# Patient Record
Sex: Female | Born: 1985 | Race: Black or African American | Hispanic: No | Marital: Single
Health system: Midwestern US, Community
[De-identification: ages and names within clinical notes are randomized; demographics above are authoritative.]

---

## 2018-01-22 ENCOUNTER — Emergency Department: Admit: 2018-01-22 | Payer: PRIVATE HEALTH INSURANCE | Primary: Family Medicine

## 2018-01-22 ENCOUNTER — Inpatient Hospital Stay
Admit: 2018-01-22 | Discharge: 2018-01-22 | Disposition: A | Discharge status: Home / Self Care | Payer: PRIVATE HEALTH INSURANCE | Attending: Emergency Medicine

## 2018-01-22 DIAGNOSIS — J189 Pneumonia, unspecified organism: Secondary | ICD-10-CM

## 2018-01-22 LAB — COMPREHENSIVE METABOLIC PANEL
ALT: 13 U/L (ref 12–78)
AST: 13 U/L — ABNORMAL LOW (ref 15–37)
Albumin: 4.1 gm/dl (ref 3.4–5.0)
Alkaline Phosphatase: 95 U/L (ref 45–117)
Anion Gap: 8 mmol/L (ref 5–15)
BUN: 7 mg/dl (ref 7–25)
CO2: 27 mEq/L (ref 21–32)
Calcium: 9.7 mg/dl (ref 8.5–10.1)
Chloride: 101 mEq/L (ref 98–107)
Creatinine: 1 mg/dl (ref 0.6–1.3)
EGFR IF NonAfrican American: >60
GFR African American: >60
Glucose: 100 mg/dl (ref 74–106)
Potassium: 3.8 mEq/L (ref 3.5–5.1)
Sodium: 135 mEq/L — ABNORMAL LOW (ref 136–145)
Total Bilirubin: 0.4 mg/dl (ref 0.2–1.0)
Total Protein: 8.6 gm/dl — ABNORMAL HIGH (ref 6.4–8.2)

## 2018-01-22 LAB — URINALYSIS W/ RFLX MICROSCOPIC
Bilirubin, Urine: NEGATIVE
Bilirubin: NEGATIVE
Blood, Urine: NEGATIVE
Blood: NEGATIVE
Glucose, Ur: NEGATIVE mg/dl
Glucose: NEGATIVE mg/dl
Ketone: NEGATIVE mg/dl
Ketones, Urine: NEGATIVE mg/dl
Leukocyte Esterase, Urine: NEGATIVE
Leukocyte Esterase: NEGATIVE
Nitrite, Urine: NEGATIVE
Nitrites: NEGATIVE
Protein, UA: 30 mg/dl — AB
Protein: 30 mg/dl — AB
Specific Gravity, UA: 1.015 (ref 1.005–1.030)
Specific gravity: 1.015 (ref 1.005–1.030)
Urobilinogen, UA, POCT: 4 mg/dl — ABNORMAL HIGH (ref 0.0–1.0)
Urobilinogen: 4 mg/dl — ABNORMAL HIGH (ref 0.0–1.0)
pH (UA): 8.5 (ref 5.0–9.0)
pH, UA: 8.5 (ref 5.0–9.0)

## 2018-01-22 LAB — CBC WITH AUTO DIFFERENTIAL
Basophils %: 0.2 % (ref 0–3)
Eosinophils %: 0.2 % (ref 0–5)
Hematocrit: 41 % (ref 37.0–50.0)
Hemoglobin: 13.3 gm/dl (ref 13.0–17.2)
Immature Granulocytes: 0.2 % (ref 0.0–3.0)
Lymphocytes %: 23.3 % — ABNORMAL LOW (ref 28–48)
MCH: 26.8 pg (ref 25.4–34.6)
MCHC: 32.4 gm/dl (ref 30.0–36.0)
MCV: 82.5 fL (ref 80.0–98.0)
MPV: 11.1 fL — ABNORMAL HIGH (ref 6.0–10.0)
Monocytes %: 10.1 % (ref 1–13)
Neutrophils %: 66 % — ABNORMAL HIGH (ref 34–64)
Nucleated RBCs: 0 (ref 0–0)
Platelets: 289 10*3/uL (ref 140–450)
RBC: 4.97 M/uL (ref 3.60–5.20)
RDW-SD: 37.9 (ref 36.4–46.3)
WBC: 4.3 10*3/uL (ref 4.0–11.0)

## 2018-01-22 LAB — POC URINE MACROSCOPIC
Bilirubin, Urine: NEGATIVE
Bilirubin: NEGATIVE
Blood, Urine: NEGATIVE
Blood: NEGATIVE
Glucose, Ur: NEGATIVE mg/dl
Glucose: NEGATIVE mg/dl
Leukocyte Esterase, Urine: NEGATIVE
Leukocyte Esterase: NEGATIVE
Nitrite, Urine: NEGATIVE
Nitrites: NEGATIVE
Protein, UA: 100 mg/dl — AB
Protein: 100 mg/dl — AB
Specific Gravity, UA: 1.02 (ref 1.005–1.030)
Specific gravity: 1.02 (ref 1.005–1.030)
Urobilinogen, UA, POCT: 4 EU/dl — ABNORMAL HIGH (ref 0.0–1.0)
Urobilinogen: 4 EU/dl — ABNORMAL HIGH (ref 0.0–1.0)
pH (UA): 8.5 (ref 5–9)
pH, UA: 8.5 (ref 5–9)

## 2018-01-22 LAB — HCG URINE, QL
HCG urine, QL: NEGATIVE
Pregnancy Test(Urn): NEGATIVE

## 2018-01-22 LAB — PROCALCITONIN
PROCALCITONIN: <0.05 ng/ml (ref 0.00–0.50)
PROCALCITONIN: <0.05 ng/ml (ref 0.00–0.50)

## 2018-01-22 LAB — POC URINE MICROSCOPIC

## 2018-01-22 LAB — METABOLIC PANEL, COMPREHENSIVE
ALT (SGPT): 13 U/L (ref 12–78)
AST (SGOT): 13 U/L — ABNORMAL LOW (ref 15–37)
Albumin: 4.1 gm/dl (ref 3.4–5.0)
Alk. phosphatase: 95 U/L (ref 45–117)
Anion gap: 8 mmol/L (ref 5–15)
BUN: 7 mg/dl (ref 7–25)
Bilirubin, total: 0.4 mg/dl (ref 0.2–1.0)
CO2: 27 mEq/L (ref 21–32)
Calcium: 9.7 mg/dl (ref 8.5–10.1)
Chloride: 101 mEq/L (ref 98–107)
Creatinine: 1 mg/dl (ref 0.6–1.3)
GFR est AA: >60
GFR est non-AA: >60
Glucose: 100 mg/dl (ref 74–106)
Potassium: 3.8 mEq/L (ref 3.5–5.1)
Protein, total: 8.6 gm/dl — ABNORMAL HIGH (ref 6.4–8.2)
Sodium: 135 mEq/L — ABNORMAL LOW (ref 136–145)

## 2018-01-22 LAB — CBC WITH AUTOMATED DIFF
BASOPHILS: 0.2 % (ref 0–3)
EOSINOPHILS: 0.2 % (ref 0–5)
HCT: 41 % (ref 37.0–50.0)
HGB: 13.3 gm/dl (ref 13.0–17.2)
IMMATURE GRANULOCYTES: 0.2 % (ref 0.0–3.0)
LYMPHOCYTES: 23.3 % — ABNORMAL LOW (ref 28–48)
MCH: 26.8 pg (ref 25.4–34.6)
MCHC: 32.4 gm/dl (ref 30.0–36.0)
MCV: 82.5 fL (ref 80.0–98.0)
MONOCYTES: 10.1 % (ref 1–13)
MPV: 11.1 fL — ABNORMAL HIGH (ref 6.0–10.0)
NEUTROPHILS: 66 % — ABNORMAL HIGH (ref 34–64)
NRBC: 0 (ref 0–0)
PLATELET: 289 10*3/uL (ref 140–450)
RBC: 4.97 M/uL (ref 3.60–5.20)
RDW-SD: 37.9 (ref 36.4–46.3)
WBC: 4.3 10*3/uL (ref 4.0–11.0)

## 2018-01-22 MED ORDER — SODIUM CHLORIDE 0.9% BOLUS IV
0.9 % | Freq: Once | INTRAVENOUS | Status: AC
Start: 2018-01-22 — End: 2018-01-22
  Administered 2018-01-22: 22:00:00 via INTRAVENOUS

## 2018-01-22 MED ORDER — ACETAMINOPHEN 325 MG TABLET
325 mg | ORAL | Status: AC
Start: 2018-01-22 — End: 2018-01-22
  Administered 2018-01-22: 20:00:00 via ORAL

## 2018-01-22 MED ORDER — SODIUM CHLORIDE 0.9 % IJ SYRG
Freq: Once | INTRAMUSCULAR | Status: AC
Start: 2018-01-22 — End: 2018-01-22
  Administered 2018-01-22: 22:00:00 via INTRAVENOUS

## 2018-01-22 MED ORDER — IBUPROFEN 600 MG TAB
600 mg | ORAL_TABLET | Freq: Four times a day (QID) | ORAL | 0 refills | Status: AC | PRN
Start: 2018-01-22 — End: ?

## 2018-01-22 MED ORDER — SODIUM CHLORIDE 0.9% BOLUS IV
0.9 % | Freq: Once | INTRAVENOUS | Status: DC
Start: 2018-01-22 — End: 2018-01-22

## 2018-01-22 MED ORDER — LEVOFLOXACIN 750 MG TAB
750 mg | ORAL_TABLET | Freq: Every day | ORAL | 0 refills | Status: AC
Start: 2018-01-22 — End: 2018-01-27

## 2018-01-22 MED FILL — TYLENOL 325 MG TABLET: 325 mg | ORAL | Qty: 3

## 2018-01-22 NOTE — ED Provider Notes (Signed)
[facility]  Emergency Department Treatment Report        Patient: Autumn Velez Age: 32 y.o. Sex: female    Date of Birth: 1985/06/17 Admit Date: 01/22/2018 PCP: Jerline Pain, MD   MRN: [id number]  CSN: [id number]  Attending: Serita Sheller, MD   Room: ER28/ER28 Time Dictated: 4:31 PM APP: Jarold Song, PA-C     Chief Complaint   Chief Complaint   Patient presents with   ??? Fever   ??? Cough       History of Present Illness   32 y.o. female otherwise healthy presenting for evaluation of fever, cough and generally feeling unwell for the past 6 days.  Patient reports that onset of symptoms she just started having fatigue generally feeling unwell, she started getting chills and sweats.  The next day she developed a cough and fever at 102F at home.  She went to urgent care facility where she was given prescription for [facility].  She is still been coughing, she has been unable to bring up much sputum.  She has had persistent fevers, chills and sweats.  No antipyretics today.  She feels generally unwell.  She denies nausea or vomiting.  Denies headache, denies abdominal pain.  Denies irritative voiding symptoms.  She does have chest pain in her right side of her chest when she coughs and feels somewhat short of breath.  She denies known history of pulmonary disease or asthma.  She does not smoke, she denies drug use.  No recent hospitalizations or surgeries.  No known sick contacts.    Review of Systems   Review of Systems   Constitutional: Positive for chills and fever.   HENT: Negative for congestion and sore throat.    Eyes: Negative for blurred vision and double vision.   Respiratory: Positive for cough and shortness of breath.    Cardiovascular: Positive for chest pain. Negative for leg swelling.   Gastrointestinal: Negative for abdominal pain, diarrhea, nausea and vomiting.   Genitourinary: Negative for dysuria and urgency.   Musculoskeletal: Negative for joint pain and myalgias.    Skin: Negative for rash.   Neurological: Negative for sensory change and focal weakness.       Past Medical/Surgical History   Patient denies past medical problems  Patient denies recent surgeries    Social History     Social History     Socioeconomic History   ??? Marital status: SINGLE     Spouse name: Not on file   ??? Number of children: Not on file   ??? Years of education: Not on file   ??? Highest education level: Not on file   Occupational History   ??? Not on file   Social Needs   ??? Financial resource strain: Not on file   ??? Food insecurity:     Worry: Not on file     Inability: Not on file   ??? Transportation needs:     Medical: Not on file     Non-medical: Not on file   Tobacco Use   ??? Smoking status: Not on file   Substance and Sexual Activity   ??? Alcohol use: Not on file   ??? Drug use: Not on file   ??? Sexual activity: Not on file   Lifestyle   ??? Physical activity:     Days per week: Not on file     Minutes per session: Not on file   ??? Stress: Not on file  Relationships   ??? Social connections:     Talks on phone: Not on file     Gets together: Not on file     Attends religious service: Not on file     Active member of club or organization: Not on file     Attends meetings of clubs or organizations: Not on file     Relationship status: Not on file   ??? Intimate partner violence:     Fear of current or ex partner: Not on file     Emotionally abused: Not on file     Physically abused: Not on file     Forced sexual activity: Not on file   Other Topics Concern   ??? Not on file   Social History Narrative   ??? Not on file       Family History   No family history on file.    Current Medications     (Not in a hospital admission)  None     Allergies     Allergies   Allergen Reactions   ??? Peanut Anaphylaxis       Physical Exam     ED Triage Vitals   ED Encounter Vitals Group      BP 01/22/18 1546 119/84      Pulse (Heart Rate) 01/22/18 1546 (!) 117      Resp Rate 01/22/18 1546 16      Temp 01/22/18 1546 (!) 101.6 ??F (38.7 ??C)       Temp src --       O2 Sat (%) 01/22/18 1546 98 %      Weight 01/22/18 1534 188 lb      Height 01/22/18 1534 5' 8"     Physical Exam   Constitutional: She is oriented to person, place, and time.   Nontoxic-appearing female laying on the stretcher no acute distress   HENT:   Head: Normocephalic and atraumatic.   Mouth/Throat: Oropharynx is clear and moist.   Eyes: Conjunctivae are normal.   Neck: Normal range of motion. Neck supple.   Cardiovascular: Regular rhythm, normal heart sounds and intact distal pulses. Exam reveals no gallop and no friction rub.   No murmur heard.  Tachycardic rate   Pulmonary/Chest: Effort normal and breath sounds normal. No respiratory distress. She has no wheezes. She has no rales.   Bilateral lower extremity symmetric without edema, calves are soft and nontender to palpation   Abdominal: Soft. She exhibits no distension. There is no tenderness. There is no rebound and no guarding.   Musculoskeletal: Normal range of motion.   Neurological: She is alert and oriented to person, place, and time.   Skin: Skin is warm and dry.   Nursing note and vitals reviewed.       Impression and Management Plan   Otherwise healthy 32 year old female with fever cough generally feeling unwell for approximately 6 days.  She arrives here febrile at 101.6, she is tachycardic.  She is not hypoxic, she is in no respiratory distress and her lungs are clear.  Upper respiratory source such as pneumonia seems most likely.  We will need to check appropriate labs, obtain cultures and urinalysis.  Obtain chest x-ray and rule out sepsis.  We will start hydration with IV fluids and provide antipyretics.    Diagnostic Studies   Lab:   Recent Results (from the past 12 hour(s))   CBC WITH AUTOMATED DIFF    Collection Time: 01/22/18  5:29 PM  Result Value Ref Range    WBC 4.3 4.0 - 11.0 1000/mm3    RBC 4.97 3.60 - 5.20 M/uL    HGB 13.3 13.0 - 17.2 gm/dl    HCT 41.0 37.0 - 50.0 %    MCV 82.5 80.0 - 98.0 fL     MCH 26.8 25.4 - 34.6 pg    MCHC 32.4 30.0 - 36.0 gm/dl    PLATELET 289 140 - 450 1000/mm3    MPV 11.1 (H) 6.0 - 10.0 fL    RDW-SD 37.9 36.4 - 46.3      NRBC 0 0 - 0      IMMATURE GRANULOCYTES 0.2 0.0 - 3.0 %    NEUTROPHILS 66.0 (H) 34 - 64 %    LYMPHOCYTES 23.3 (L) 28 - 48 %    MONOCYTES 10.1 1 - 13 %    EOSINOPHILS 0.2 0 - 5 %    BASOPHILS 0.2 0 - 3 %   METABOLIC PANEL, COMPREHENSIVE    Collection Time: 01/22/18  5:29 PM   Result Value Ref Range    Sodium 135 (L) 136 - 145 mEq/L    Potassium 3.8 3.5 - 5.1 mEq/L    Chloride 101 98 - 107 mEq/L    CO2 27 21 - 32 mEq/L    Glucose 100 74 - 106 mg/dl    BUN 7 7 - 25 mg/dl    Creatinine 1.0 0.6 - 1.3 mg/dl    GFR est AA >60.0      GFR est non-AA >60      Calcium 9.7 8.5 - 10.1 mg/dl    AST (SGOT) 13 (L) 15 - 37 U/L    ALT (SGPT) 13 12 - 78 U/L    Alk. phosphatase 95 45 - 117 U/L    Bilirubin, total 0.4 0.2 - 1.0 mg/dl    Protein, total 8.6 (H) 6.4 - 8.2 gm/dl    Albumin 4.1 3.4 - 5.0 gm/dl    Anion gap 8 5 - 15 mmol/L   PROCALCITONIN    Collection Time: 01/22/18  5:29 PM   Result Value Ref Range    PROCALCITONIN <0.05 0.00 - 0.50 ng/ml   URINALYSIS W/ RFLX MICROSCOPIC    Collection Time: 01/22/18  5:46 PM   Result Value Ref Range    Color YELLOW YELLOW,STRAW      Appearance HAZY (A) CLEAR      Glucose NEGATIVE NEGATIVE,Negative mg/dl    Bilirubin NEGATIVE NEGATIVE,Negative      Ketone NEGATIVE NEGATIVE,Negative mg/dl    Specific gravity 1.015 1.005 - 1.030      Blood NEGATIVE NEGATIVE,Negative      pH (UA) 8.5 5.0 - 9.0      Protein 30 (A) NEGATIVE,Negative mg/dl    Urobilinogen 4.0 (H) 0.0 - 1.0 mg/dl    Nitrites NEGATIVE NEGATIVE,Negative      Leukocyte Esterase NEGATIVE NEGATIVE,Negative     HCG URINE, QL    Collection Time: 01/22/18  5:46 PM   Result Value Ref Range    HCG urine, QL NEGATIVE NEGATIVE     POC URINE MICROSCOPIC    Collection Time: 01/22/18  5:46 PM   Result Value Ref Range    Epithelial cells, squamous 15-29 /LPF    RBC OCCASIONAL /HPF     CRYSTALS, CALCIUM OXALATE 1+ /HPF   POC URINE MACROSCOPIC    Collection Time: 01/22/18  5:55 PM   Result Value Ref Range    Glucose Negative NEGATIVE,Negative mg/dl  Bilirubin Negative NEGATIVE,Negative      Ketone Trace (A) NEGATIVE,Negative mg/dl    Specific gravity 1.020 1.005 - 1.030      Blood Negative NEGATIVE,Negative      pH (UA) 8.5 5 - 9      Protein 100 (A) NEGATIVE,Negative mg/dl    Urobilinogen 4.0 (H) 0.0 - 1.0 EU/dl    Nitrites Negative NEGATIVE,Negative      Leukocyte Esterase Negative NEGATIVE,Negative      Color Yellow      Appearance Clear       Labs Reviewed   CBC WITH AUTOMATED DIFF - Abnormal; Notable for the following components:       Result Value    MPV 11.1 (*)     NEUTROPHILS 66.0 (*)     LYMPHOCYTES 23.3 (*)     All other components within normal limits   METABOLIC PANEL, COMPREHENSIVE - Abnormal; Notable for the following components:    Sodium 135 (*)     AST (SGOT) 13 (*)     Protein, total 8.6 (*)     All other components within normal limits   URINALYSIS W/ RFLX MICROSCOPIC - Abnormal; Notable for the following components:    Appearance HAZY (*)     Protein 30 (*)     Urobilinogen 4.0 (*)     All other components within normal limits   POC URINE MACROSCOPIC - Abnormal; Notable for the following components:    Ketone Trace (*)     Protein 100 (*)     Urobilinogen 4.0 (*)     All other components within normal limits   CULTURE, BLOOD   CULTURE, BLOOD   CULTURE, URINE   PROCALCITONIN   HCG URINE, QL   POC URINE MICROSCOPIC       Imaging:    Xr Chest Pa Lat    Result Date: 01/22/2018  EXAM: Frontal and lateral views of the chest. INDICATIONS: Cough, fever;    COMPARISON: None. FINDINGS: Right upper lobe airspace opacity, consistent with pneumonia. No pleural effusion or pneumothorax. Normal cardiomediastinal contours. Thoracic spine is normal for age.     IMPRESSION: Right upper lobe airspace opacity, consistent with pneumonia.  Follow-up radiograph in 4-6 weeks following treatment is recommended.       ED Course/Medical Decision Making   Patient was given Tylenol arrival, she was hydrated with IV fluids.  Her chest x-ray confirms right upper lobe opacity consistent with pneumonia.  Discussed with patient she does have a community acquired pneumonia.  Her labs are unremarkable, no evidence of leukocytosis.  Normal procalcitonin.  She is not pregnant.  She is a monitored on cardiac monitor, her pulse has improved, she is hemodynamically stable.  Currently afebrile.  She is in no respiratory distress.  She is able to tolerate p.o. just fine, she is otherwise healthy and stable for outpatient management of her CAP.  She is given prescription for Levaquin as well as Motrin.  She is instructed to start the antibiotic tonight as soon as possible.  She should contact her primary care physician for follow-up in 24-48 hours.  Patient given strict return precautions should she develop new or worsening symptoms such as as difficulty breathing, chest pain, vomiting or inability to tolerate PO, persistent fever over 100.65F at home despite Tylenol and Motrin or other concerns.  Should rest and drink plenty fluids to stay hydrated.  Patient expressed understanding, she is in agreement with this plan.      Medications  sodium chloride 0.9 % bolus infusion 1,000 mL (1,000 mL IntraVENous New Bag 01/22/18 1750)     Followed by   sodium chloride 0.9 % bolus infusion 1,000 mL (1,000 mL IntraVENous New Bag 01/22/18 1750)     Followed by   sodium chloride 0.9 % bolus infusion 559 mL (has no administration in time range)   acetaminophen (TYLENOL) tablet 975 mg (975 mg Oral Given 01/22/18 1604)   sodium chloride (NS) flush 5-10 mL (10 mL IntraVENous Given 01/22/18 1749)     Final Diagnosis       ICD-10-CM ICD-9-CM   1. Community acquired pneumonia of right lung, unspecified part of lung J18.9 486       Disposition   Home in stable condition       Current Discharge Medication List      START taking these medications    Details   levoFLOXacin (LEVAQUIN) 750 mg tablet Take 1 Tab by mouth daily for 5 days.  Qty: 5 Tab, Refills: 0      ibuprofen (MOTRIN) 600 mg tablet Take 1 Tab by mouth every six (6) hours as needed for Pain.  Qty: 20 Tab, Refills: 0           Jarold Song, PA-C  January 22, 2018  4:31 PM    The patient was personally evaluated by myself and Dr. Meda Coffee, Ilene Qua, MD who agrees with the above assessment and plan.  Please note that portions of this note were completed with a voice recognition program. Efforts were made to edit the dictations but occasionally words are mis-transcribed.   My signature above authenticates this document and my orders, the final ??  diagnosis (es), discharge prescription (s), and instructions in the Epic ??  record.  If you have any questions please contact [phone].  ??  Nursing notes have been reviewed by the physician/ advanced practice ??  Clinician.

## 2018-01-22 NOTE — ED Notes (Signed)
Pt alert and oriented. Speech clear and well paced. Pt with bronchospastic cough present. Seen at urgent care ~1 week ago and prescribed tessalon and motrin. No significant relief. Pt has run out of prescribed cough meds and is taking OTC robitussin with minimal relief. States it feels like there is something trying to come up when she coughs but she has not been able to expectorate it. Fevers are persistent despite taking OTC meds (took aleve last night and was given tylenol while in triage).   1618--transported via WC to XR.

## 2018-01-22 NOTE — ED Notes (Signed)
Pt alert and oriented. Speech clear and well paced. Pt given d/c instructions with (2) rx. Reinforced education re: fluids/alternating of tylenol and motrin.   Ambulatory with steady gait. Per provider, pt does not need to complete all IVF as ordered. Pt has completed ~1500 mL of ordered fluids.

## 2018-01-22 NOTE — ED Triage Notes (Signed)
Seen at [facility]  9/21 and diagnosed with URI. Symptoms have continued since with fever and cough- no relief from taking Motrin or prescribed Benzonatate. Patient ran out of the Benzonatate and is now taking OTC Robitussin.

## 2018-01-22 NOTE — ED Provider Notes (Signed)
ED Provider Notes by Raquel James, PA-C at 01/22/18 1631                Author: Raquel James, PA-C  Service: EMERGENCY  Author Type: Physician Assistant       Filed: 01/22/18 1919  Date of Service: 01/22/18 1631  Status: Attested           Editor: Malaia Buchta, Verlan Friends, PA-C (Physician Assistant)  Cosigner: Serita Sheller, MD at 01/22/18 2214          Attestation signed by Serita Sheller, MD at 01/22/18 2214          I interviewed and examined the patient. I discussed the patient with the advanced practice provider, Jarold Song, and I agree with the evaluation and plan  as documented here.      On my exam lungs are clear to auscultation.  Cardiac exam has resolved tachycardia.  Normal heart sounds.  I reviewed the chest x-ray myself.  Patient has right upper lobe pneumonia.  Initially we were concerned for possibility of sepsis.  White blood  cell count is normal and tachycardia is resolved with IV fluids.  Patient is well-appearing and amenable to outpatient treatment.  Patient is tolerating oral fluids without vomiting.  I think she is appropriate candidate for outpatient treatment for pneumonia.   I recommended Levaquin.  I agree with the documentation as detailed below.                                    [facility]   Emergency Department Treatment Report                Patient: Autumn Velez  Age: 32 y.o.  Sex: female          Date of Birth: 1986-04-26  Admit Date: 01/22/2018  PCP: Jerline Pain, MD     MRN: [id number]   CSN: [id number]   Attending: Serita Sheller, MD         Room: ER28/ER28  Time Dictated: 4:31 PM  APP: Jarold Song, PA-C        Chief Complaint      Chief Complaint       Patient presents with        ?  Fever        ?  Cough             History of Present Illness     32 y.o. female  otherwise healthy presenting for evaluation of fever, cough and generally feeling unwell for the past 6 days.  Patient reports that onset of symptoms she just  started having fatigue generally feeling unwell, she started getting chills and sweats.  The  next day she developed a cough and fever at 102F at home.  She went to urgent care facility where she was given prescription for [facility].  She is still been coughing, she has been unable to bring up much sputum.  She has had persistent fevers,  chills and sweats.  No antipyretics today.  She feels generally unwell.  She denies nausea or vomiting.  Denies headache, denies abdominal pain.  Denies irritative voiding symptoms.  She does have chest pain in her right side of her chest when she coughs  and feels somewhat short of breath.  She denies known history of pulmonary disease or asthma.  She does not  smoke, she denies drug use.  No recent hospitalizations or surgeries.  No known sick contacts.        Review of Systems     Review of Systems    Constitutional: Positive for chills and fever .    HENT: Negative for congestion and sore throat.     Eyes: Negative for blurred vision and double vision.    Respiratory: Positive for cough and shortness of breath .     Cardiovascular: Positive for chest pain. Negative for leg swelling.    Gastrointestinal: Negative for abdominal pain, diarrhea, nausea and vomiting.    Genitourinary: Negative for dysuria and urgency.    Musculoskeletal: Negative for joint pain and myalgias.    Skin: Negative for rash.    Neurological: Negative for sensory change and focal weakness.            Past Medical/Surgical History     Patient denies past medical problems   Patient denies recent surgeries        Social History          Social History          Socioeconomic History         ?  Marital status:  SINGLE              Spouse name:  Not on file         ?  Number of children:  Not on file     ?  Years of education:  Not on file     ?  Highest education level:  Not on file       Occupational History        ?  Not on file       Social Needs         ?  Financial resource strain:  Not on file         ?  Food insecurity:              Worry:  Not on file         Inability:  Not on file        ?  Transportation needs:              Medical:  Not on file         Non-medical:  Not on file       Tobacco Use         ?  Smoking status:  Not on file       Substance and Sexual Activity         ?  Alcohol use:  Not on file     ?  Drug use:  Not on file     ?  Sexual activity:  Not on file       Lifestyle        ?  Physical activity:              Days per week:  Not on file         Minutes per session:  Not on file         ?  Stress:  Not on file       Relationships        ?  Social connections:              Talks on phone:  Not on file         Gets together:  Not on file  Attends religious service:  Not on file         Active member of club or organization:  Not on file         Attends meetings of clubs or organizations:  Not on file         Relationship status:  Not on file        ?  Intimate partner violence:              Fear of current or ex partner:  Not on file         Emotionally abused:  Not on file         Physically abused:  Not on file         Forced sexual activity:  Not on file        Other Topics  Concern        ?  Not on file       Social History Narrative        ?  Not on file             Family History     No family history on file.        Current Medications        (Not in a hospital admission)     None          Allergies          Allergies        Allergen  Reactions         ?  Peanut  Anaphylaxis             Physical Exam          ED Triage Vitals     ED Encounter Vitals Group            BP  01/22/18 1546  119/84        Pulse (Heart Rate)  01/22/18 1546  (!) 117        Resp Rate  01/22/18 1546  16        Temp  01/22/18 1546  (!) 101.6 ??F (38.7 ??C)        Temp src  --          O2 Sat (%)  01/22/18 1546  98 %        Weight  01/22/18 1534  188 lb            Height  01/22/18 1534  '5\' 8"'         Physical Exam    Constitutional: She is oriented to person, place, and time.   Nontoxic-appearing female  laying on the stretcher no acute distress    HENT:    Head: Normocephalic and atraumatic.    Mouth/Throat: Oropharynx is clear and moist.    Eyes: Conjunctivae are normal.    Neck: Normal range of motion. Neck supple.    Cardiovascular: Regular rhythm, normal heart sounds and intact distal pulses. Exam reveals no gallop and no friction rub.    No murmur heard.  Tachycardic rate    Pulmonary/Chest: Effort normal and breath sounds normal. No respiratory distress. She has no wheezes. She has no rales.   Bilateral lower extremity symmetric without edema, calves are soft  and nontender to palpation    Abdominal: Soft. She exhibits no distension. There is no tenderness. There is no rebound and no guarding.   Musculoskeletal: Normal range of motion.   Neurological:  She is  alert and oriented to person, place, and time.    Skin: Skin is warm and dry.    Nursing note and vitals reviewed.            Impression and Management Plan     Otherwise healthy 32 year old female with fever cough generally feeling unwell for approximately 6 days.  She arrives here febrile at 101.6, she is tachycardic.   She is not hypoxic, she is in no respiratory distress and her lungs are clear.  Upper respiratory source such as pneumonia seems most likely.  We will need to check appropriate labs, obtain cultures and urinalysis.  Obtain chest x-ray and rule out sepsis.   We will start hydration with IV fluids and provide antipyretics.        Diagnostic Studies     Lab:      Recent Results (from the past 12 hour(s))     CBC WITH AUTOMATED DIFF          Collection Time: 01/22/18  5:29 PM         Result  Value  Ref Range            WBC  4.3  4.0 - 11.0 1000/mm3       RBC  4.97  3.60 - 5.20 M/uL       HGB  13.3  13.0 - 17.2 gm/dl       HCT  41.0  37.0 - 50.0 %       MCV  82.5  80.0 - 98.0 fL       MCH  26.8  25.4 - 34.6 pg       MCHC  32.4  30.0 - 36.0 gm/dl       PLATELET  289  140 - 450 1000/mm3       MPV  11.1 (H)  6.0 - 10.0 fL       RDW-SD  37.9   36.4 - 46.3         NRBC  0  0 - 0         IMMATURE GRANULOCYTES  0.2  0.0 - 3.0 %       NEUTROPHILS  66.0 (H)  34 - 64 %       LYMPHOCYTES  23.3 (L)  28 - 48 %       MONOCYTES  10.1  1 - 13 %       EOSINOPHILS  0.2  0 - 5 %       BASOPHILS  0.2  0 - 3 %       METABOLIC PANEL, COMPREHENSIVE          Collection Time: 01/22/18  5:29 PM         Result  Value  Ref Range            Sodium  135 (L)  136 - 145 mEq/L       Potassium  3.8  3.5 - 5.1 mEq/L       Chloride  101  98 - 107 mEq/L       CO2  27  21 - 32 mEq/L       Glucose  100  74 - 106 mg/dl       BUN  7  7 - 25 mg/dl            Creatinine  1.0  0.6 - 1.3 mg/dl            GFR est  AA  >60.0          GFR est non-AA  >60          Calcium  9.7  8.5 - 10.1 mg/dl       AST (SGOT)  13 (L)  15 - 37 U/L       ALT (SGPT)  13  12 - 78 U/L       Alk. phosphatase  95  45 - 117 U/L       Bilirubin, total  0.4  0.2 - 1.0 mg/dl       Protein, total  8.6 (H)  6.4 - 8.2 gm/dl       Albumin  4.1  3.4 - 5.0 gm/dl       Anion gap  8  5 - 15 mmol/L       PROCALCITONIN          Collection Time: 01/22/18  5:29 PM         Result  Value  Ref Range            PROCALCITONIN  <0.05  0.00 - 0.50 ng/ml       URINALYSIS W/ RFLX MICROSCOPIC          Collection Time: 01/22/18  5:46 PM         Result  Value  Ref Range            Color  YELLOW  YELLOW,STRAW         Appearance  HAZY (A)  CLEAR         Glucose  NEGATIVE  NEGATIVE,Negative mg/dl       Bilirubin  NEGATIVE  NEGATIVE,Negative         Ketone  NEGATIVE  NEGATIVE,Negative mg/dl       Specific gravity  1.015  1.005 - 1.030         Blood  NEGATIVE  NEGATIVE,Negative         pH (UA)  8.5  5.0 - 9.0         Protein  30 (A)  NEGATIVE,Negative mg/dl       Urobilinogen  4.0 (H)  0.0 - 1.0 mg/dl       Nitrites  NEGATIVE  NEGATIVE,Negative         Leukocyte Esterase  NEGATIVE  NEGATIVE,Negative         HCG URINE, QL          Collection Time: 01/22/18  5:46 PM         Result  Value  Ref Range            HCG urine, QL  NEGATIVE  NEGATIVE         POC  URINE MICROSCOPIC          Collection Time: 01/22/18  5:46 PM         Result  Value  Ref Range            Epithelial cells, squamous  15-29  /LPF       RBC  OCCASIONAL  /HPF       CRYSTALS, CALCIUM OXALATE  1+  /HPF       POC URINE MACROSCOPIC          Collection Time: 01/22/18  5:55 PM         Result  Value  Ref Range            Glucose  Negative  NEGATIVE,Negative mg/dl  Bilirubin  Negative  NEGATIVE,Negative         Ketone  Trace (A)  NEGATIVE,Negative mg/dl       Specific gravity  1.020  1.005 - 1.030         Blood  Negative  NEGATIVE,Negative         pH (UA)  8.5  5 - 9         Protein  100 (A)  NEGATIVE,Negative mg/dl       Urobilinogen  4.0 (H)  0.0 - 1.0 EU/dl       Nitrites  Negative  NEGATIVE,Negative         Leukocyte Esterase  Negative  NEGATIVE,Negative         Color  Yellow               Appearance  Clear             Labs Reviewed       CBC WITH AUTOMATED DIFF - Abnormal; Notable for the following components:            Result  Value            MPV  11.1 (*)         NEUTROPHILS  66.0 (*)         LYMPHOCYTES  23.3 (*)            All other components within normal limits       METABOLIC PANEL, COMPREHENSIVE - Abnormal; Notable for the following components:            Sodium  135 (*)         AST (SGOT)  13 (*)         Protein, total  8.6 (*)            All other components within normal limits       URINALYSIS W/ RFLX MICROSCOPIC - Abnormal; Notable for the following components:            Appearance  HAZY (*)         Protein  30 (*)         Urobilinogen  4.0 (*)            All other components within normal limits       POC URINE MACROSCOPIC - Abnormal; Notable for the following components:            Ketone  Trace (*)         Protein  100 (*)         Urobilinogen  4.0 (*)            All other components within normal limits       CULTURE, BLOOD     CULTURE, BLOOD     CULTURE, URINE     PROCALCITONIN     HCG URINE, QL       POC URINE MICROSCOPIC           Imaging:     Xr Chest Pa Lat      Result  Date: 01/22/2018   EXAM: Frontal and lateral views of the chest. INDICATIONS: Cough, fever;    COMPARISON: None. FINDINGS: Right upper lobe airspace opacity, consistent with pneumonia. No pleural effusion or pneumothorax. Normal cardiomediastinal contours. Thoracic spine  is normal for age.       IMPRESSION: Right upper lobe airspace opacity, consistent with pneumonia. Follow-up radiograph in 4-6 weeks following treatment is  recommended.            ED Course/Medical Decision Making     Patient was given Tylenol arrival, she was hydrated with IV fluids.  Her chest x-ray confirms right upper lobe opacity consistent with pneumonia.  Discussed with  patient she does have a community acquired pneumonia.  Her labs are unremarkable, no evidence of leukocytosis.  Normal procalcitonin.  She is not pregnant.  She is a monitored on cardiac monitor, her pulse has improved, she is hemodynamically stable.   Currently afebrile.  She is in no respiratory distress.  She is able to tolerate p.o. just fine, she is otherwise healthy and stable for outpatient management of her CAP.  She is given prescription for Levaquin as well as Motrin.  She is instructed to  start the antibiotic tonight as soon as possible.  She should contact her primary care physician for follow-up in 24-48 hours.  Patient given strict return precautions should she develop new or worsening symptoms such as as difficulty breathing, chest  pain, vomiting or inability to tolerate PO, persistent fever over 100.38F at home despite Tylenol and Motrin or other concerns.  Should rest and drink plenty fluids to stay hydrated.  Patient expressed understanding, she is in agreement with this plan.           Medications       sodium chloride 0.9 % bolus infusion 1,000 mL (1,000 mL IntraVENous New Bag 01/22/18 1750)       Followed by     sodium chloride 0.9 % bolus infusion 1,000 mL (1,000 mL IntraVENous New Bag 01/22/18 1750)       Followed by     sodium chloride 0.9 % bolus  infusion 559 mL (has no administration in time range)     acetaminophen (TYLENOL) tablet 975 mg (975 mg Oral Given 01/22/18 1604)       sodium chloride (NS) flush 5-10 mL (10 mL IntraVENous Given 01/22/18 1749)          Final Diagnosis                 ICD-10-CM  ICD-9-CM          1.  Community acquired pneumonia of right lung, unspecified part of lung  J18.9  486             Disposition     Home in stable condition           Current Discharge Medication List              START taking these medications          Details        levoFLOXacin (LEVAQUIN) 750 mg tablet  Take 1 Tab by mouth daily for 5 days.   Qty: 5 Tab, Refills:  0               ibuprofen (MOTRIN) 600 mg tablet  Take 1 Tab by mouth every six (6) hours as needed for Pain.   Qty: 20 Tab, Refills:  0                      Jarold Song, PA-C   January 22, 2018   4:31 PM      The patient was personally evaluated by myself and Dr. Meda Coffee, Ilene Qua, MD who agrees with the above assessment and plan.   Please note that portions of this note were completed with a voice recognition  program. Efforts were made to edit the dictations but occasionally words are mis-transcribed.    My signature above authenticates this document and my orders, the final ??   diagnosis (es), discharge prescription (s), and instructions in the Epic ??   record.   If you have any questions please contact [phone].   ??   Nursing notes have been reviewed by the physician/ advanced practice ??   Clinician.

## 2018-01-22 NOTE — ED Notes (Signed)
Pt alert and oriented. Speech clear and well paced. Pt with bronchospastic cough present. Seen at urgent care ~1 week ago and prescribed tessalon and motrin. No significant relief. Pt has run out of prescribed cough meds and is taking OTC robitussin with minimal relief. States it feels like there is something trying to come up when she coughs but she has not been able to expectorate it. Fevers are persistent despite taking OTC meds (took aleve last night and was given tylenol while in triage).   1618--transported via WC to XR.

## 2018-01-22 NOTE — ED Notes (Signed)
Seen at [facility]  9/21 and diagnosed with URI. Symptoms have continued since with fever and cough- no relief from taking Motrin or prescribed Benzonatate. Patient ran out of the Benzonatate and is now taking OTC Robitussin.

## 2018-01-24 LAB — CULTURE, URINE
Isolate: 50000
Isolate: 50000

## 2018-01-28 LAB — CULTURE, BLOOD 1
BLOOD CULTURE RESULT: NO GROWTH
BLOOD CULTURE RESULT: NO GROWTH

## 2018-01-28 LAB — CULTURE, BLOOD
Blood Culture Result: NO GROWTH
Blood Culture Result: NO GROWTH

## 2019-02-15 IMAGING — CR DX Chest X-Ray Portable
1 series · 1 of 1 positions shown · non-contrast
Comparison: Chest x-ray 10/10/2015

HISTORY: Chest pain
TECHNIQUE: AP chest x-ray

[chest ap vg]
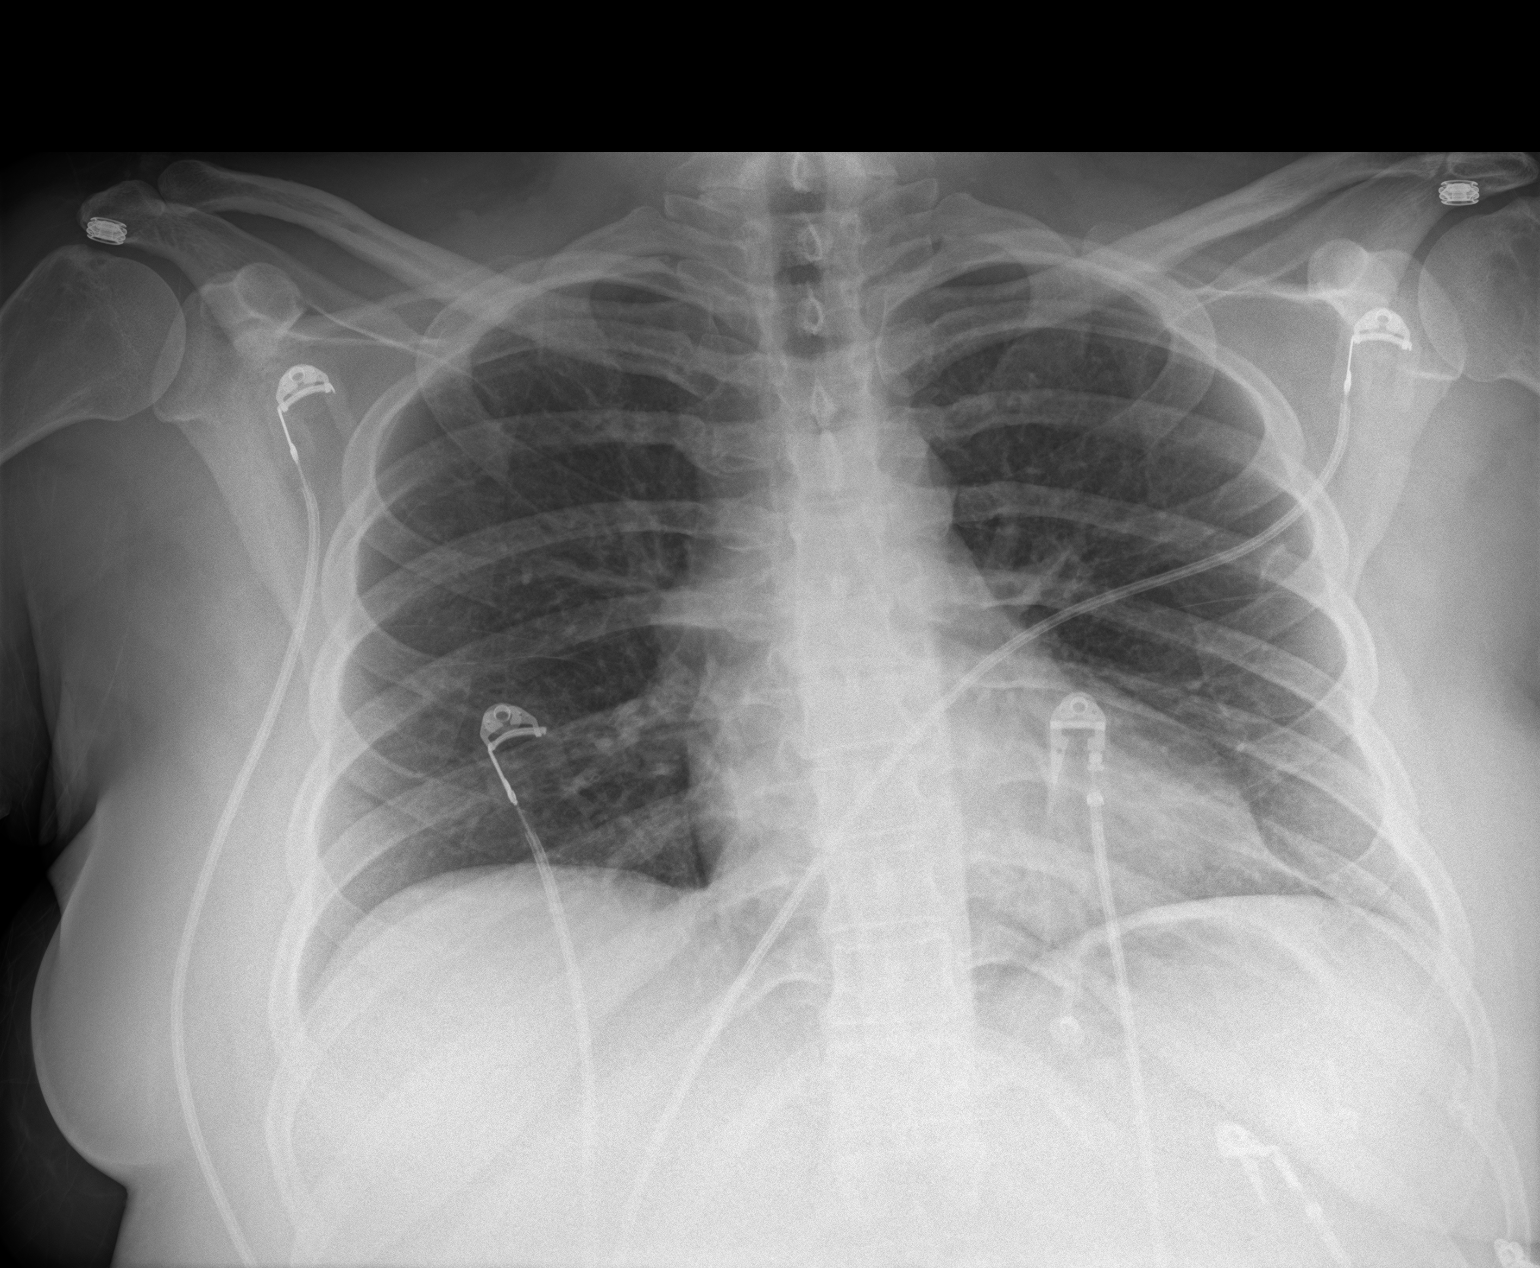

[1 of 1 positions shown; findings below may reference images not displayed]

FINDINGS: No pneumothorax, airspace consolidation, or pleural effusion.                             
 Normal heart size.
IMPRESSION: No acute abnormality is identified on chest x-ray.

## 2019-07-17 LAB — HEMOGLOBIN A1C
Estimated Avg Glucose, External: 103 mg/dL (ref 91–123)
Hemoglobin A1C, External: 5.2 % (ref 4.8–5.6)

## 2020-06-24 ENCOUNTER — Emergency Department: Admit: 2020-06-24 | Payer: BLUE CROSS/BLUE SHIELD | Primary: Family Medicine

## 2020-06-24 ENCOUNTER — Inpatient Hospital Stay
Admit: 2020-06-24 | Discharge: 2020-06-24 | Disposition: A | Discharge status: Home / Self Care | Payer: BLUE CROSS/BLUE SHIELD | Attending: Emergency Medicine

## 2020-06-24 DIAGNOSIS — S66911A Strain of unspecified muscle, fascia and tendon at wrist and hand level, right hand, initial encounter: Secondary | ICD-10-CM

## 2020-06-24 NOTE — ED Notes (Signed)
Pt seen and discharged by NP Louellen.

## 2020-06-24 NOTE — ED Notes (Signed)
Complains of throbbing right wrist pain that radiates to her elbow since this morning. Denies injury to hand.

## 2020-06-24 NOTE — ED Provider Notes (Signed)
ED Provider Notes by Rejeana Brock, NP at 06/24/20 1348                Author: Rejeana Brock, NP  Service: EMERGENCY  Author Type: Nurse Practitioner       Filed: 06/24/20 1514  Date of Service: 06/24/20 1348  Status: Attested           Editor: Rejeana Brock, NP (Nurse Practitioner)  Cosigner: Posey Pronto, MD at 06/24/20 2017          Attestation signed by Posey Pronto, MD at 06/24/20 2017          I have discussed the patient with the mid-level provider. I agree with their evaluation and treatment plan as documented here. I did not see this patient personally,  but I was available to see the patient at their request.                                    [facility]   Emergency Department Treatment Report          Patient: Autumn Velez  Age: 35 y.o.  Sex: female          Date of Birth: 16-Jun-1985  Admit Date: 06/24/2020  PCP: Tomma Lightning, MD     MRN: [id number]   CSN: [id number]   Posey Pronto, MD         Room: T01/T01  Time Dictated: 1:48 PM  Rejeana Brock, NP           Chief Complaint      Chief Complaint       Patient presents with        ?  Wrist Pain              History of Present Illness     35 y.o. female  presents to the emergency department for right wrist pain.  The patient reports that she awoke with some mild discomfort to her right wrist however now has become more of a throbbing pain.  Reports that the pain is located to the pinky side of her hand  and wrist radiating to her elbow.  She denies any recent injuries or falls to her right hand.  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  She is right-hand dominant.      She denies any loss of sensation to her extremities, chest pain, shortness of breath, one-sided weakness.        Review of Systems     Review of Systems    Constitutional: Negative for chills and fever.    HENT: Negative for congestion and sore throat.     Eyes: Negative for  pain.    Respiratory: Negative for cough and shortness of breath.     Cardiovascular: Negative for chest pain.    Gastrointestinal: Negative for abdominal pain, nausea and vomiting.    Genitourinary: Negative for dysuria.    Musculoskeletal: Positive for joint pain (Right wrist pain) . Negative for myalgias.    Skin: Negative for rash.    Neurological: Positive for tingling. Negative for dizziness.            Past Medical/Surgical History     History reviewed. No pertinent past medical history.   History reviewed. No pertinent surgical history.  Social History          Social History          Socioeconomic History         ?  Marital status:  SINGLE       Tobacco Use         ?  Smoking status:  Never Smoker     ?  Smokeless tobacco:  Never Used       Substance and Sexual Activity         ?  Alcohol use:  Not Currently         ?  Drug use:  Never             Family History     History reviewed. No pertinent family history.        Current Medications          Prior to Admission Medications     Prescriptions  Last Dose  Informant  Patient Reported?  Taking?      ibuprofen (MOTRIN) 600 mg tablet      No  No      Sig: Take 1 Tab by mouth every six (6) hours as needed for Pain.      norethindrone-ethinyl estradiol-iron (MICROGESTIN FE1.5/30) 1.5 mg-30 mcg (21)/75 mg (7) tab      Yes  Yes      Sig: Take 1 Tablet by mouth daily.               Facility-Administered Medications: None             Allergies          Allergies        Allergen  Reactions         ?  Peanut  Anaphylaxis             Physical Exam        Patient Vitals for the past 24 hrs:            Temp  Pulse  Resp  BP  SpO2            06/24/20 1328  98.1 ??F (36.7 ??C)  90  16  (!) 139/91  100 %              Constitutional: Well developed, well nourished. No acute distress. Nontoxic appearing.   EYES: Conjuctivae clear, lids normal. No discharge   RESP: Clear and equal breath sounds. No wheezing, rhonchi, crackles, or rales. No respiratory distress, tachypnea,  or accessory muscle use.   CV: Heart: Regular, without murmurs, gallops, rubs, or thrills.  2+ radial pulses bilaterally.   GI:  Abdomen soft, non-distended, nontender to palpation.   MSK: Moves all 4 extremities spontaneously. She is changing positions without difficulty.  Demonstrates active range of motion of her right  wrist.  Phalen sign positive.  Negative Tinel's, Finkelstein's.  There is mild swelling noted to the ventral aspect of the patient's right wrist.  No obvious deformities.  No erythema or redness.  Patient demonstrates active range of motion of her right  elbow.  No complaints of tenderness to palpation.   SKIN: Warm and dry.  Capillary refill less than 2 seconds.   NEURO: Alert, oriented. Sensation intact, motor strength equal and symmetric. Follows commands.        Impression and Management Plan     This is a patient presenting to the emergency department for right wrist pain.  Low suspicion for  acute septic joint and toxic tenosynovitis.  Will obtain x-ray of the right wrist  to evaluate for acute bony abnormality.      DDX includes but is not limited to: Neuropathy, fracture, effusion, carpal tunnel     Diagnostic Studies     Lab:    No results found for this or any previous visit (from the past 12 hour(s)).      Imaging:     XR WRIST RT AP/LAT/OBL MIN 3V      Result Date: 06/24/2020   Exam: XR WRIST RT AP/LAT/OBL MIN 3V Indication: wrist pain, mild swelling, throbbing pain Additional information: None. COMPARISON: None FINDINGS: No fractures or dislocations. No focal swelling. No significant arthrosis.       IMPRESSION: No acute findings.         Medical Decision Making/ED Course      I evaluated the patient's right wrist x-ray my interpretation is no acute fracture or dislocation.  Findings  were discussed with the patient who verbalized understanding.      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  I discussed that she will need to follow-up  with her primary care provider for reevaluation and possible specialist referral if symptoms persist.  Symptomatic  management and activity modification were discussed.  She was placed in a wrist splint for comfort.  Return precautions discussed.  Patient verbalized understanding of all instructions and was amenable to discharge.         ED Medications Administered   Medications - No data to display           Final Diagnosis                 ICD-10-CM  ICD-9-CM          1.  Strain of right wrist, initial encounter   S66.911A  842.00          2.  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 Truddie Crumble, Jonelle Sports, MD who agrees with the above assessment and plan.      Dragon medical dictation software was used for portions of this report. Unintended errors may occur.       My signature above authenticates this document and my orders, the final diagnosis(es), discharge prescription(s), and instructions in the Epic record.      If you have any questions please contact [phone].       Nursing notes have been reviewed by the physician/advanced practice clinician.

## 2020-12-23 IMAGING — CT CTA Chest Dissection
2 of 11 series · 16 of 38 positions shown · IV contrast (omnipaque)
Comparison: none

CTA Chest Dissection
INDICATION: Evaluate for Dissection.                                                     
 Pertinent History: Chest pain \T\ SOB x 1day                                              
 Surgical History: tubal ligation                                                          
 Cancer: None                                                                              
 GFR (past 30 days): Not applicable                                                        
 Smoking status:                                                                           
 Intravenous contrast: 120 mL Omnipaque 350 total                                          
 Technologist Comments: S/P Code stroke CTA imaging
TECHNIQUE: Computed tomographic angiography; empiric delay for contrast timing; 3D        
 angiographic MIP (maximum intensity projection) reconstructions; sagittal and coronal     
 reformats. Pre and post IV contrasted images.                                             
 Utilized dose reduction techniques include: Automated Exposure Control, vendor specific   
 iterative reconstruction technique

[Series 8: ax post c/a/p scan range thin · axial · 0.92mm/px · z∈[-632,-87]mm · 14 of 419 slices shown]
[im 28/419  lung]
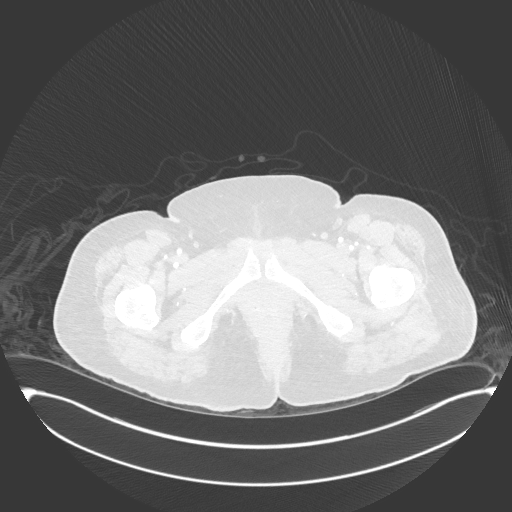
[im 56/419  mediastinal]
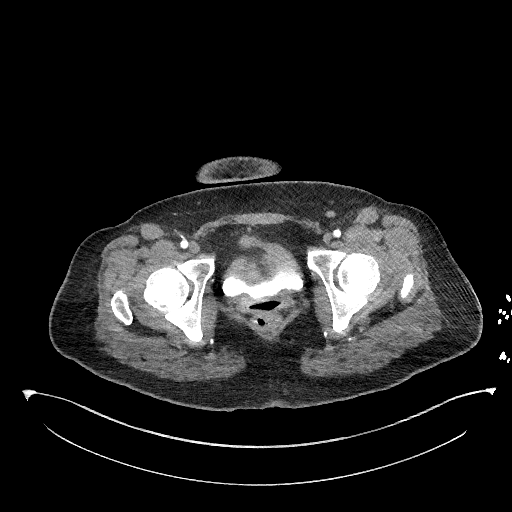
[im 84/419  lung]
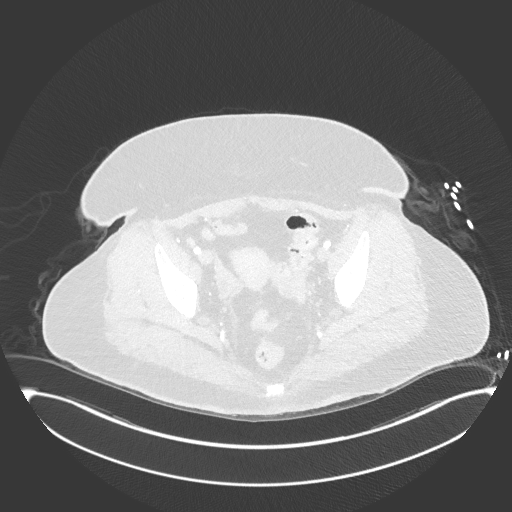
[im 112/419  mediastinal]
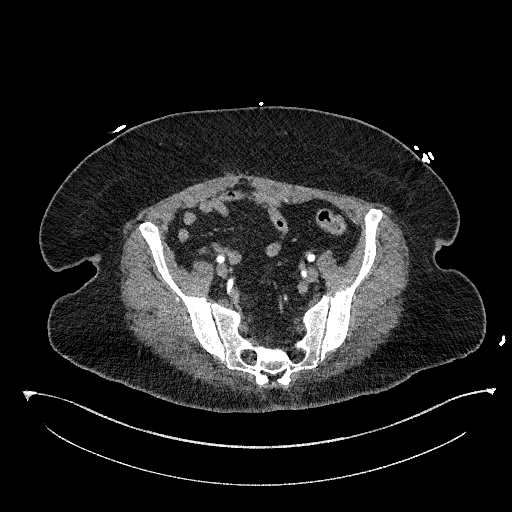
[im 140/419  lung]
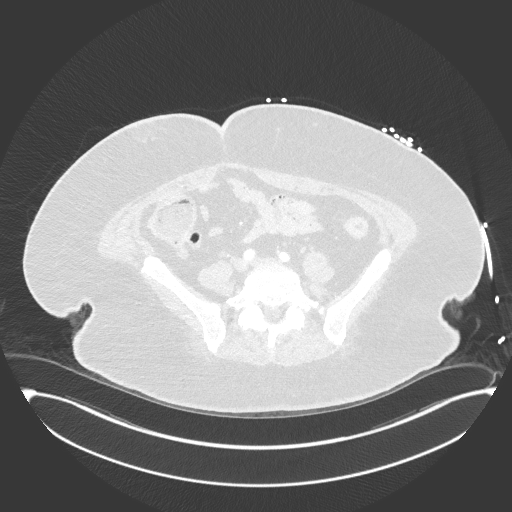
[im 168/419  mediastinal]
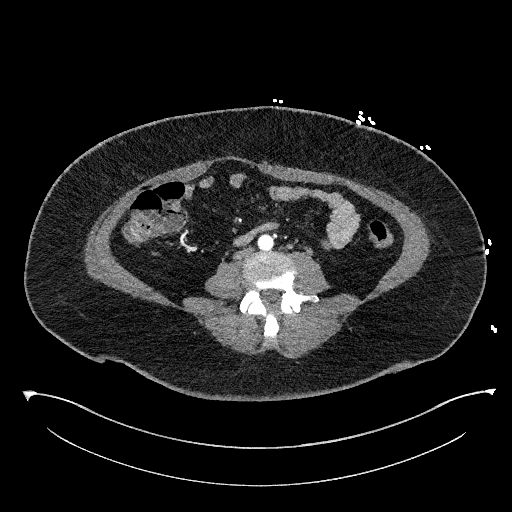
[im 196/419  lung]
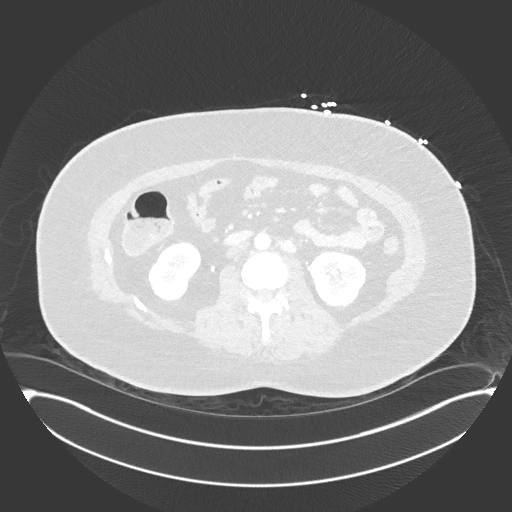
[im 223/419  mediastinal]
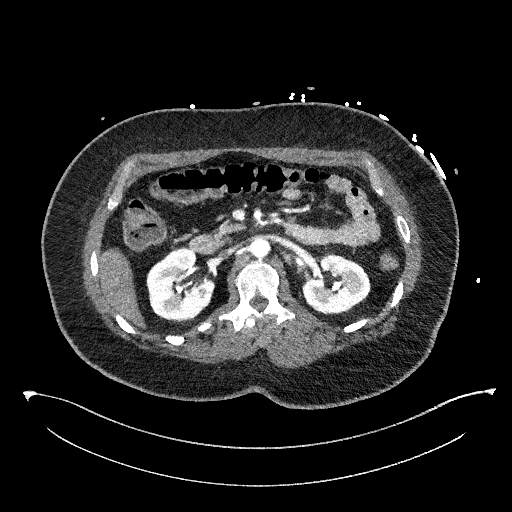
[im 251/419  lung]
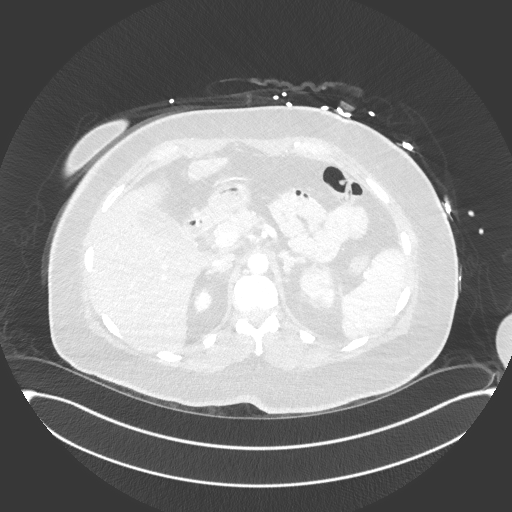
[im 279/419  mediastinal]
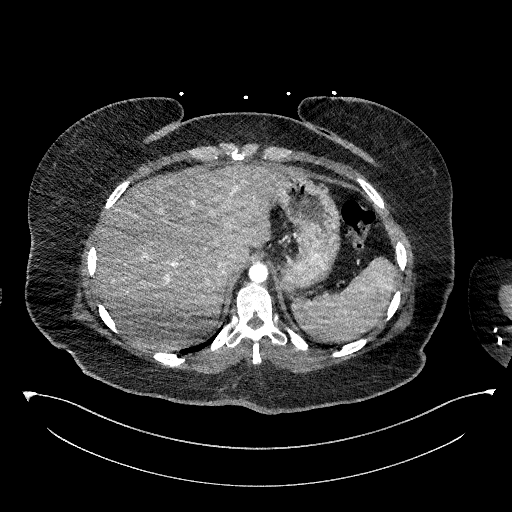
[im 307/419  lung]
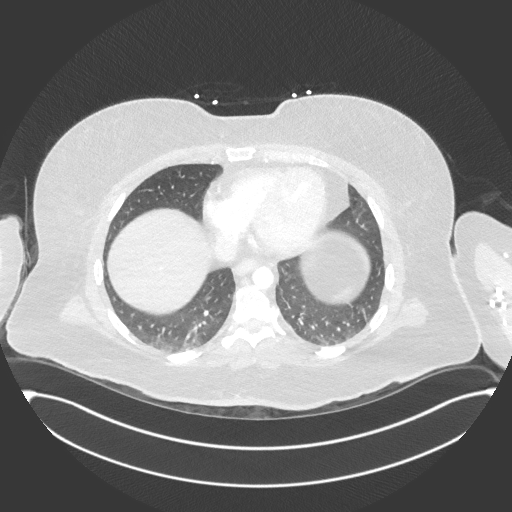
[im 335/419  mediastinal]
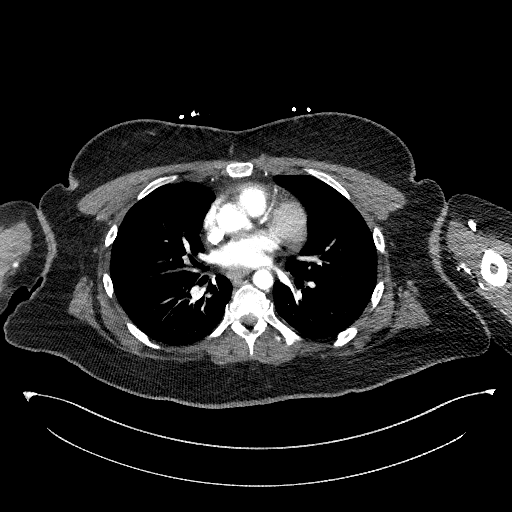
[im 363/419  lung]
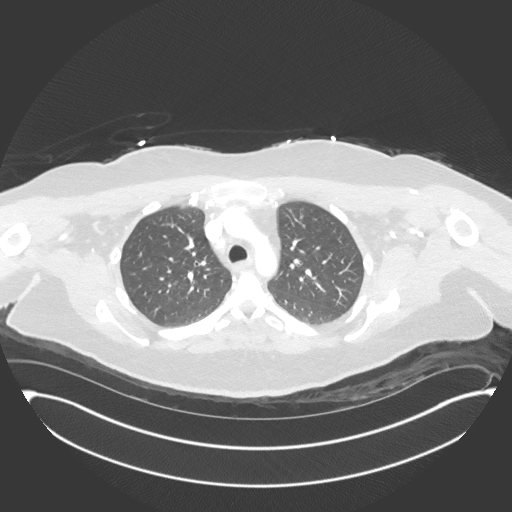
[im 391/419  mediastinal]
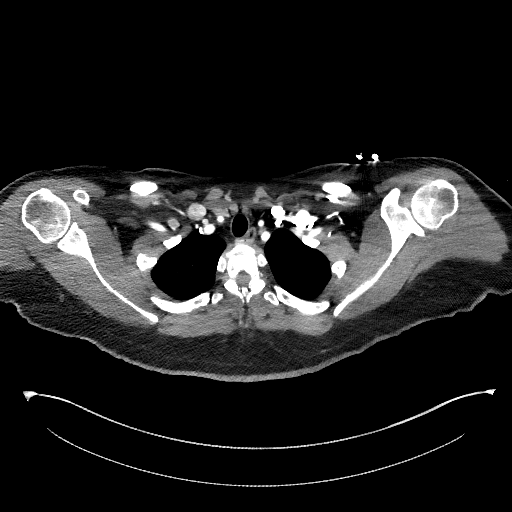

[Series 602: cor post chest · coronal · 0.92mm/px · 2 of 76 slices shown]
[im 26/76  mediastinal]
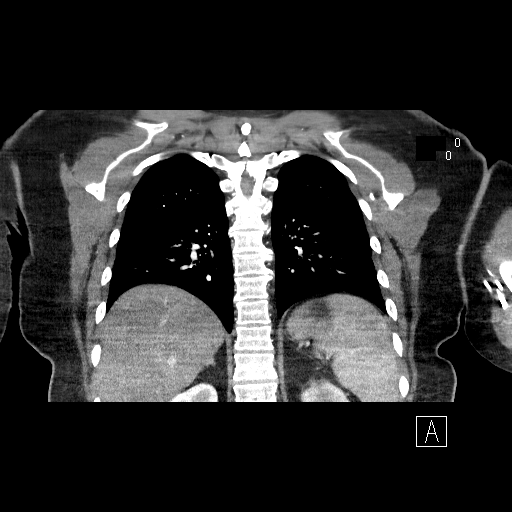
[im 51/76  mediastinal]
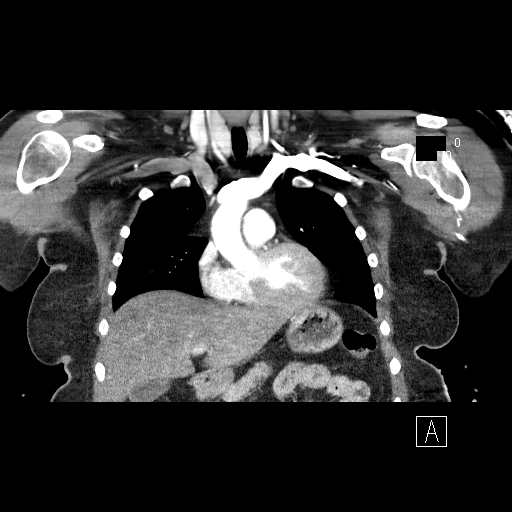

[16 of 38 positions shown; findings below may reference images not displayed]

FINDINGS: Noncontrast imaging reveals no hyperdensity in the thoracic aortic wall to      
 suggest intramural hematoma. Calcified mediastinal lymph nodes present. No pleural or     
 pericardial effusion.                                                                     
 Next, post contrast imaging was obtained. Thoracic aorta is nonaneurysmal without         
 dissection flap. Heart size is within normal limits. No pleural or pericardial effusion.  
 Please see separate dictation CT abdomen for findings below the hemidiaphragms. No        
 adenopathy. The central airways are patent.                                               
 Lung window examination reveals the lungs to be predominantly clear with minimal streaky  
 bilateral lower lobe atelectasis.                                                         
 Bone window examination reveals no acute osseous abnormality.
IMPRESSION: Essentially normal examination.

## 2020-12-23 IMAGING — CT CTA Neck
2 of 13 series · 5 of 33 positions shown · IV contrast (Omnipaque)
Comparison: none

CTA Neck
INDICATION: Specific Reason Freetext Ischemic Stroke/Transient Ischemic                  
 Attack.                                                                                   
 Pertinent History: Right side face no sensation, Left side body no sensation, [id number];   
 Diabetic, Hypertension                                                                    
 Surgical History: None                                                                    
 Cancer: None                                                                              
 GFR (past 30 days): Not applicable                                                        
 Intravenous contrast: 60 mL Omnipaque 350 total                                           
 Technologist Comments: Scan without labs
TECHNIQUE: 1. Helical acquisition with multiplanar reconstruction.                                   
 2. Z-axis range: Aortic arch to just above sella turcica.                                 
 3. 3-D angiographic reconstruction (volume rendering and maximum intensity projection)    
 from manipulation of volume metric data utilizing an independent workstation.             
 Reference for carotid artery stenosis measurements:                                       
 -Direct angiographic stenosis calculation based on the distal internal carotid artery     
 luminal diameter as the denominator                                                       
 -NASCET criteria                                                                          
 Utilized dose reduction techniques include: Automated Exposure Control, vendor specific   
 iterative reconstruction technique                                                        
 Aortic arch: 3 great vessels arise from the aortic arch. No origin stenoses involve the   
 great vessels. No aortic arch aneurysm. No aortic arch dissection.                        
 Right carotid arteries: Right common carotid artery no stenosis. No dissection. Right     
 internal carotid artery no stenosis. No dissection. No pseudoaneurysm. Distal right       
 internal carotid artery no stenosis.                                                      
 Left carotid arteries: Left common carotid artery no stenosis. No dissection. Left        
 internal carotid artery no stenosis. Left external carotid artery patent. Distal left     
 Vertebral arteries: Bilaterally patent, symmetrically sized vertebral arteries. No        
 vertebral artery dissection. Vertebral arteries join to form the basilar.                 
 No apical pneumothorax. No apical parenchymal consolidation.

[Series 5: scan range only · axial · 0.48mm/px · z∈[+132,+314]mm · 3 of 728 slices shown]
[im 182/728  soft-tissue]
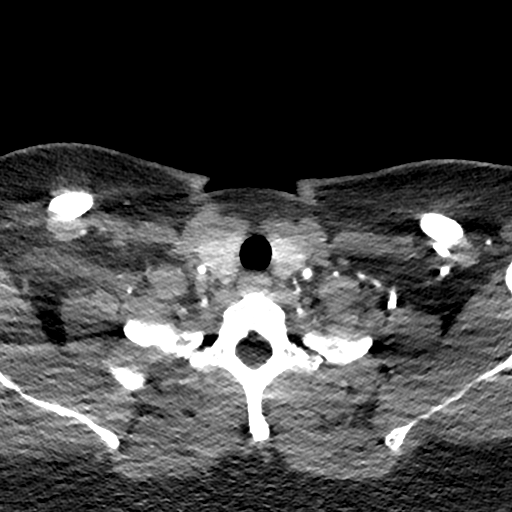
[im 364/728  bone]
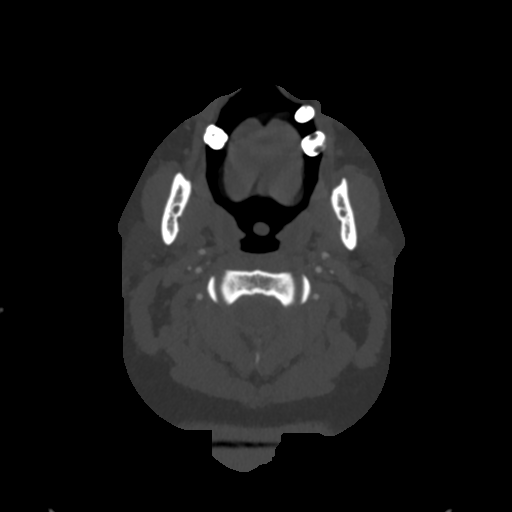
[im 546/728  soft-tissue]
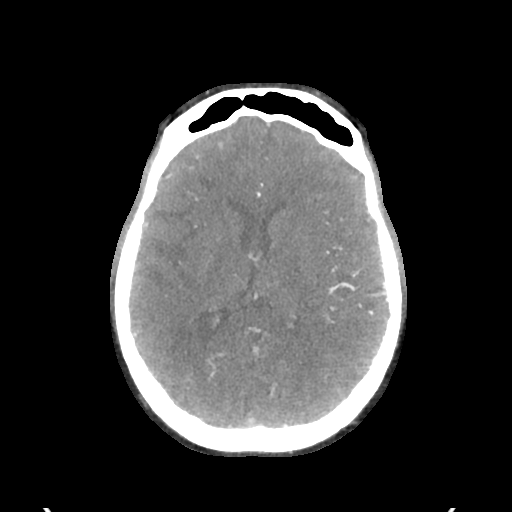

[Series 6: ax carotid mpr · axial · 0.49mm/px · z∈[+133,+211]mm · 2 of 468 slices shown]
[im 156/468  soft-tissue]
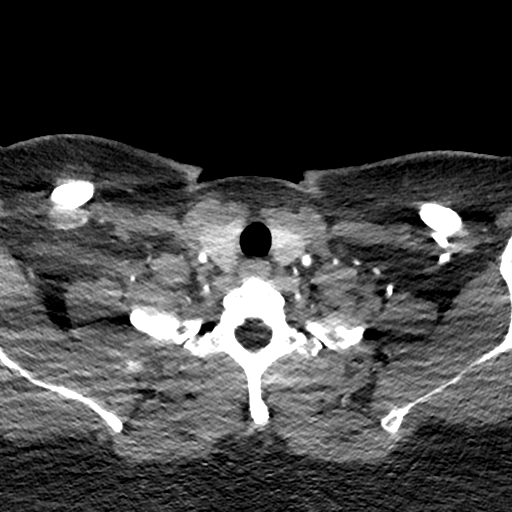
[im 312/468  soft-tissue]
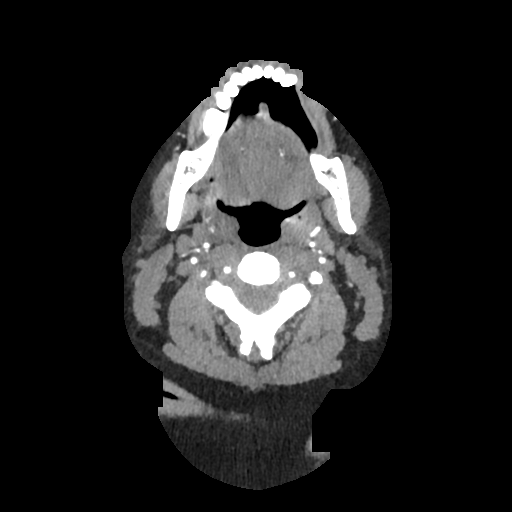

[5 of 33 positions shown; findings below may reference images not displayed]

IMPRESSION: 1. No carotid artery stenosis, dissection, or pseudoaneurysm.                             
 2. No vertebral dissection nor stenosis.

## 2021-03-08 IMAGING — CT CTA Chest-PE
2 of 6 series · 17 of 36 positions shown · IV contrast (omnipaque)
Comparison: none

CTA Chest-PE
INDICATION: Chest Pain.  ELEVATED D-DIMER
 Pertinent History: Patient presents to [facility] mid line chest pain, HTN. 
 Patient reports [DATE] pain rate, no relief post meds per EMS.     Diabetes, hypertension,  
 PVD, CVA [id number]                                                                           
 Surgical History:                                                                         
 Cancer: None                                                                              
 GFR (past 30 days): >39 ml/min                                                            
 Smoking status: Current                                                                   
 Intravenous contrast: 70 mL Omnipaque 350                                                 
 Technologist Comments: None
TECHNIQUE: Computed tomographic pulmonary angiography; helical acquisition without        
 cardiac gating; empiric delay for contrast timing; 3D angiographic MIP (maximum intensity 
 projection) reconstructions; sagittal and coronal reformats.                              
 Utilized dose reduction techniques include: Automated Exposure Control, vendor specific   
 iterative reconstruction technique                                                        
 Comparison is made with CT angiogram for aortic dissection from December 23, 2020.          
 There is adequate opacification of the pulmonary arterial tree and no intraluminal        
 filling defects are identified to suggest acute pulmonary embolism. Images of the         
 thoracic aorta demonstrate no evidence of dissection or aneurysm. No pleural or           
 pericardial effusions. No coronary artery calcifications. Trace left-sided pleural        
 effusion. Slight peribronchial thickening in the lower lobes greater on the left with     
 mild atelectasis and groundglass opacities considered inflammatory. Additional slight     
 atelectasis and groundglass opacity at the right lung base. There is a 6 mm nodule seen   
 in the right middle lobe just above the major fissure seen on series 2 image #27. This is 
 nonspecific and is more likely inflammatory. However this patient with history as         
 smoking, follow-up CT in 6 months is recommended.

[Series 3: ax post chest-qa fov · axial · 0.67mm/px · z∈[-387,-170]mm · 15 of 243 slices shown]
[im 13/243  lung]
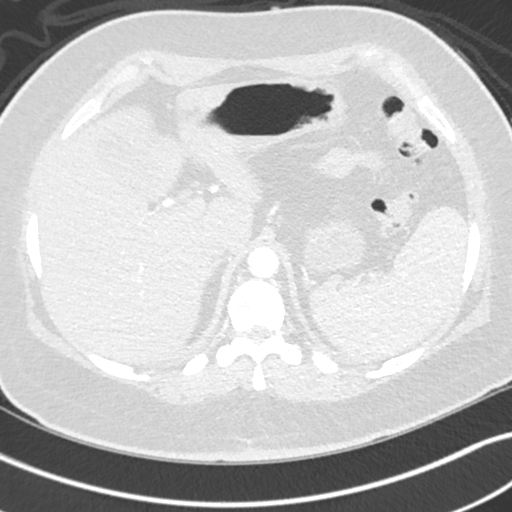
[im 26/243  mediastinal]
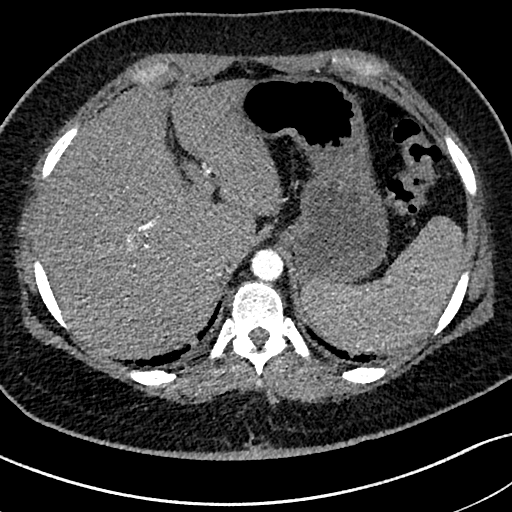
[im 51/243  lung]
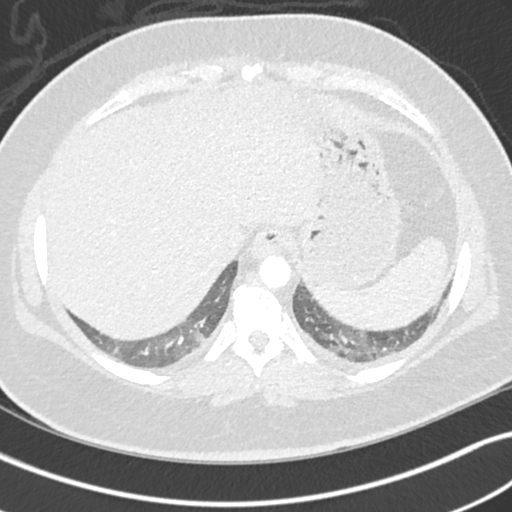
[im 64/243  mediastinal]
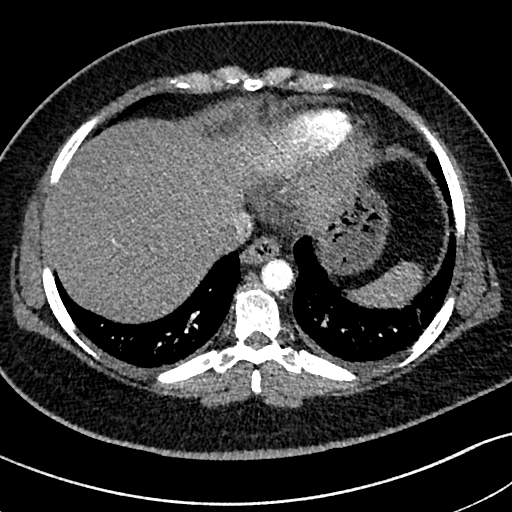
[im 77/243  lung]
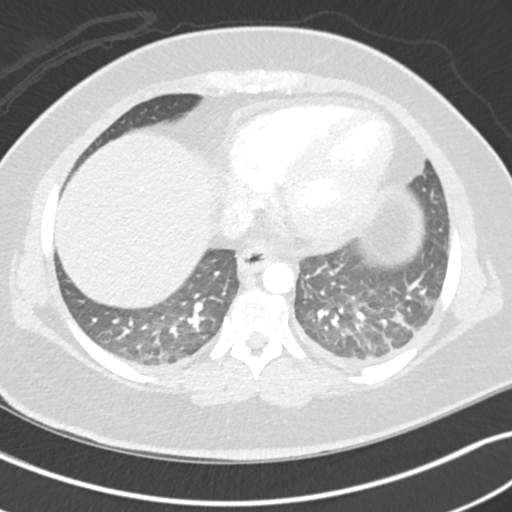
[im 90/243  mediastinal]
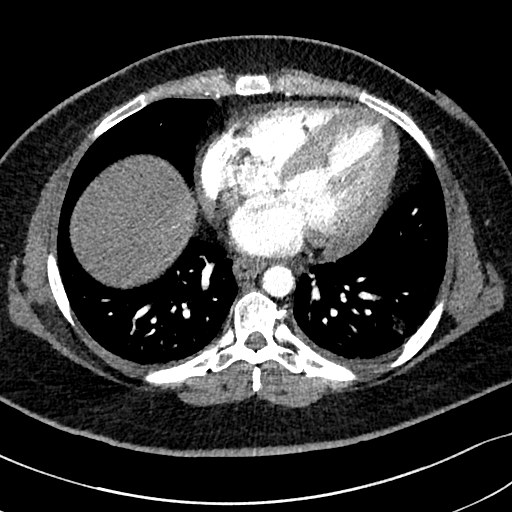
[im 102/243  lung]
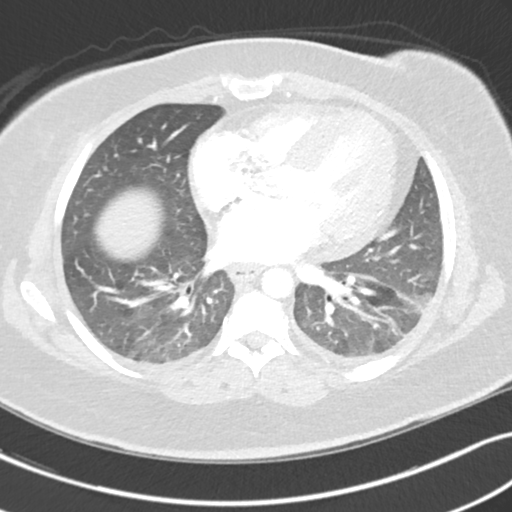
[im 128/243  mediastinal]
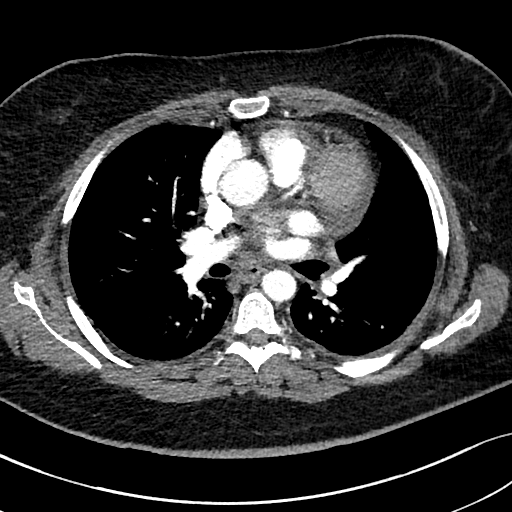
[im 141/243  lung]
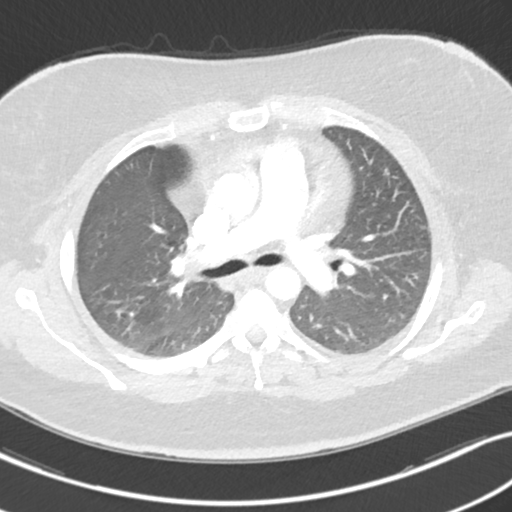
[im 153/243  mediastinal]
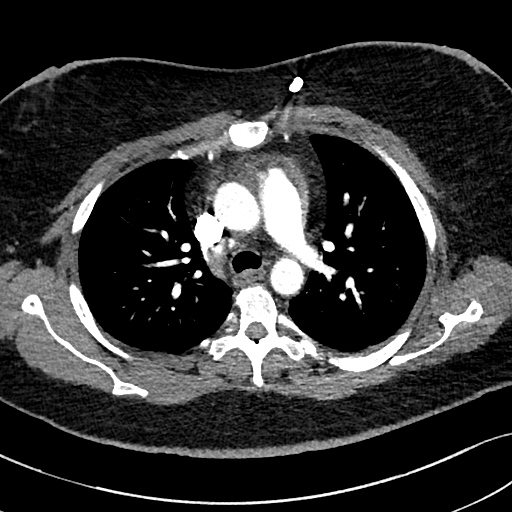
[im 166/243  lung]
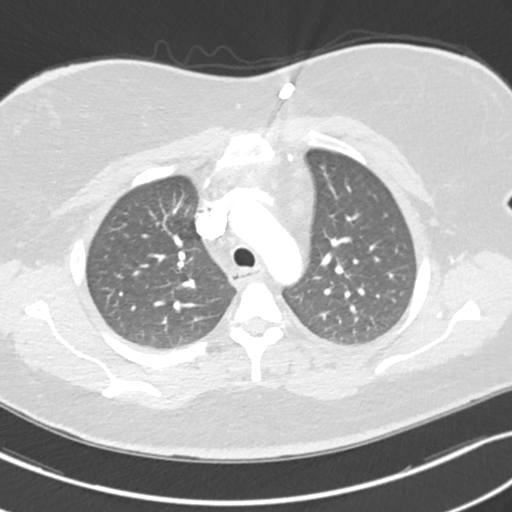
[im 179/243  mediastinal]
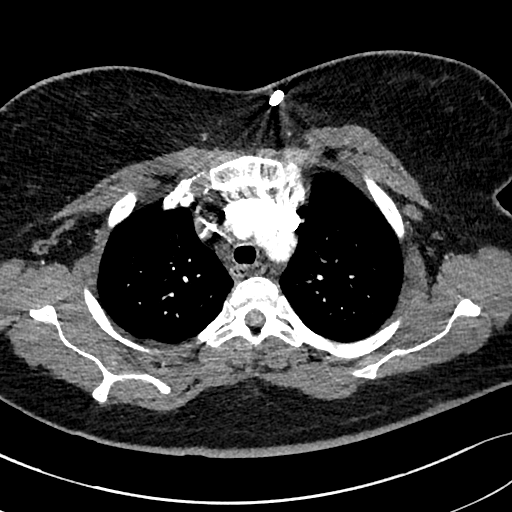
[im 204/243  lung]
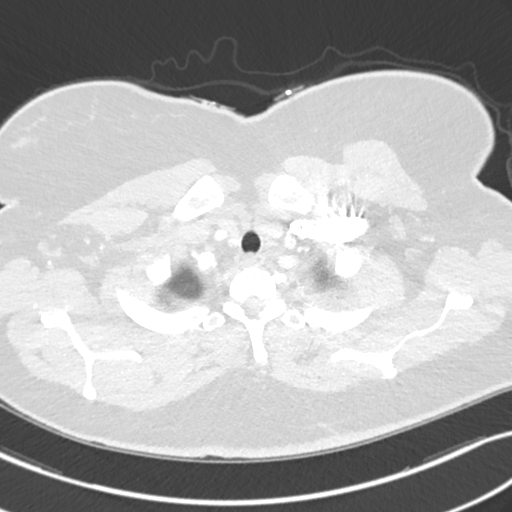
[im 217/243  mediastinal]
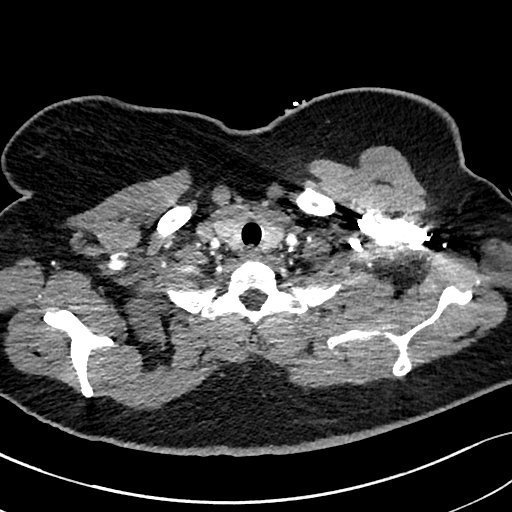
[im 230/243  lung]
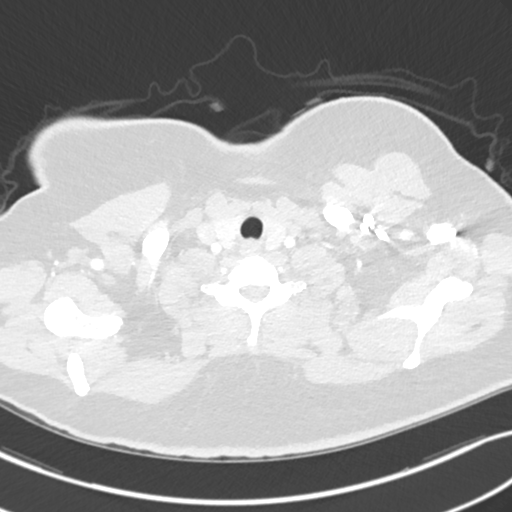

[Series 6: cor post chest · coronal · 0.47mm/px · 2 of 153 slices shown]
[im 51/153  mediastinal]
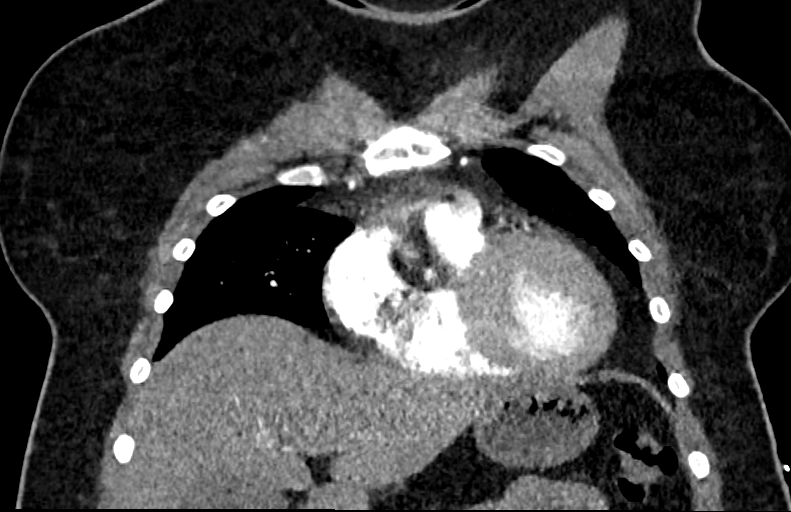
[im 102/153  mediastinal]
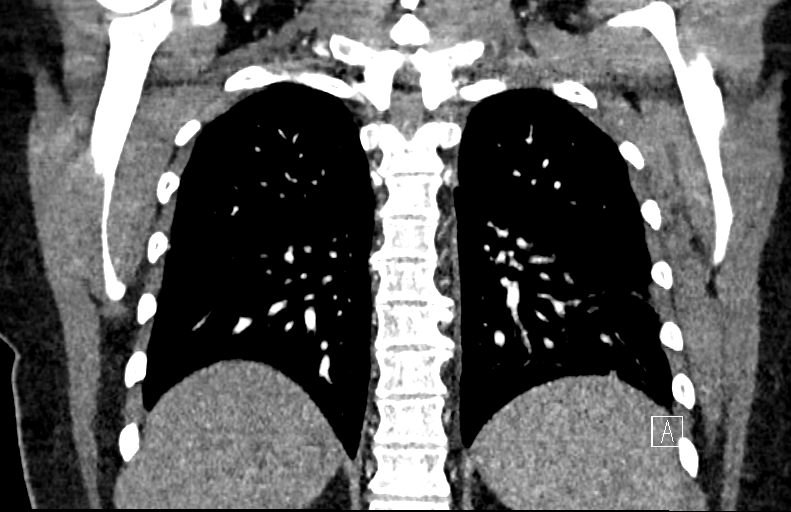

[17 of 36 positions shown; findings below may reference images not displayed]

IMPRESSION: 1. No evidence of acute pulmonary embolism or aortic dissection.                          
 2. Slight bronchial wall thickening in the lower lobes bilaterally greater on the left    
 where there is associated minimal atelectasis and patchy groundglass opacity considered   
 inflammatory with trace left-sided pleural effusion. Additional slight groundglass        
 opacity and atelectasis at the right lung base.                                           
 3. 6 mm right middle lobe nodule, more likely benign. Recommend CT follow-up in 6         
 months.

## 2021-03-08 IMAGING — CR DX Chest X-Ray Portable
1 series · 1 of 1 positions shown · non-contrast
Comparison: none

Portable AP chest x-ray.
HISTORY: Chest pain with hypertension and dyspnea.                                        
 Comparison is made with previous exam from December 23, 2020.                               
 Portable AP erect image of the chest is submitted. Heart size is normal. The pulmonary    
 vascularity is normal. No acute infiltrates, pleural effusions or pneumothorax is         
 identified. Tubular foreign body overlies the left medial chest, etiology                 
 indeterminate.

[chest ap vg]
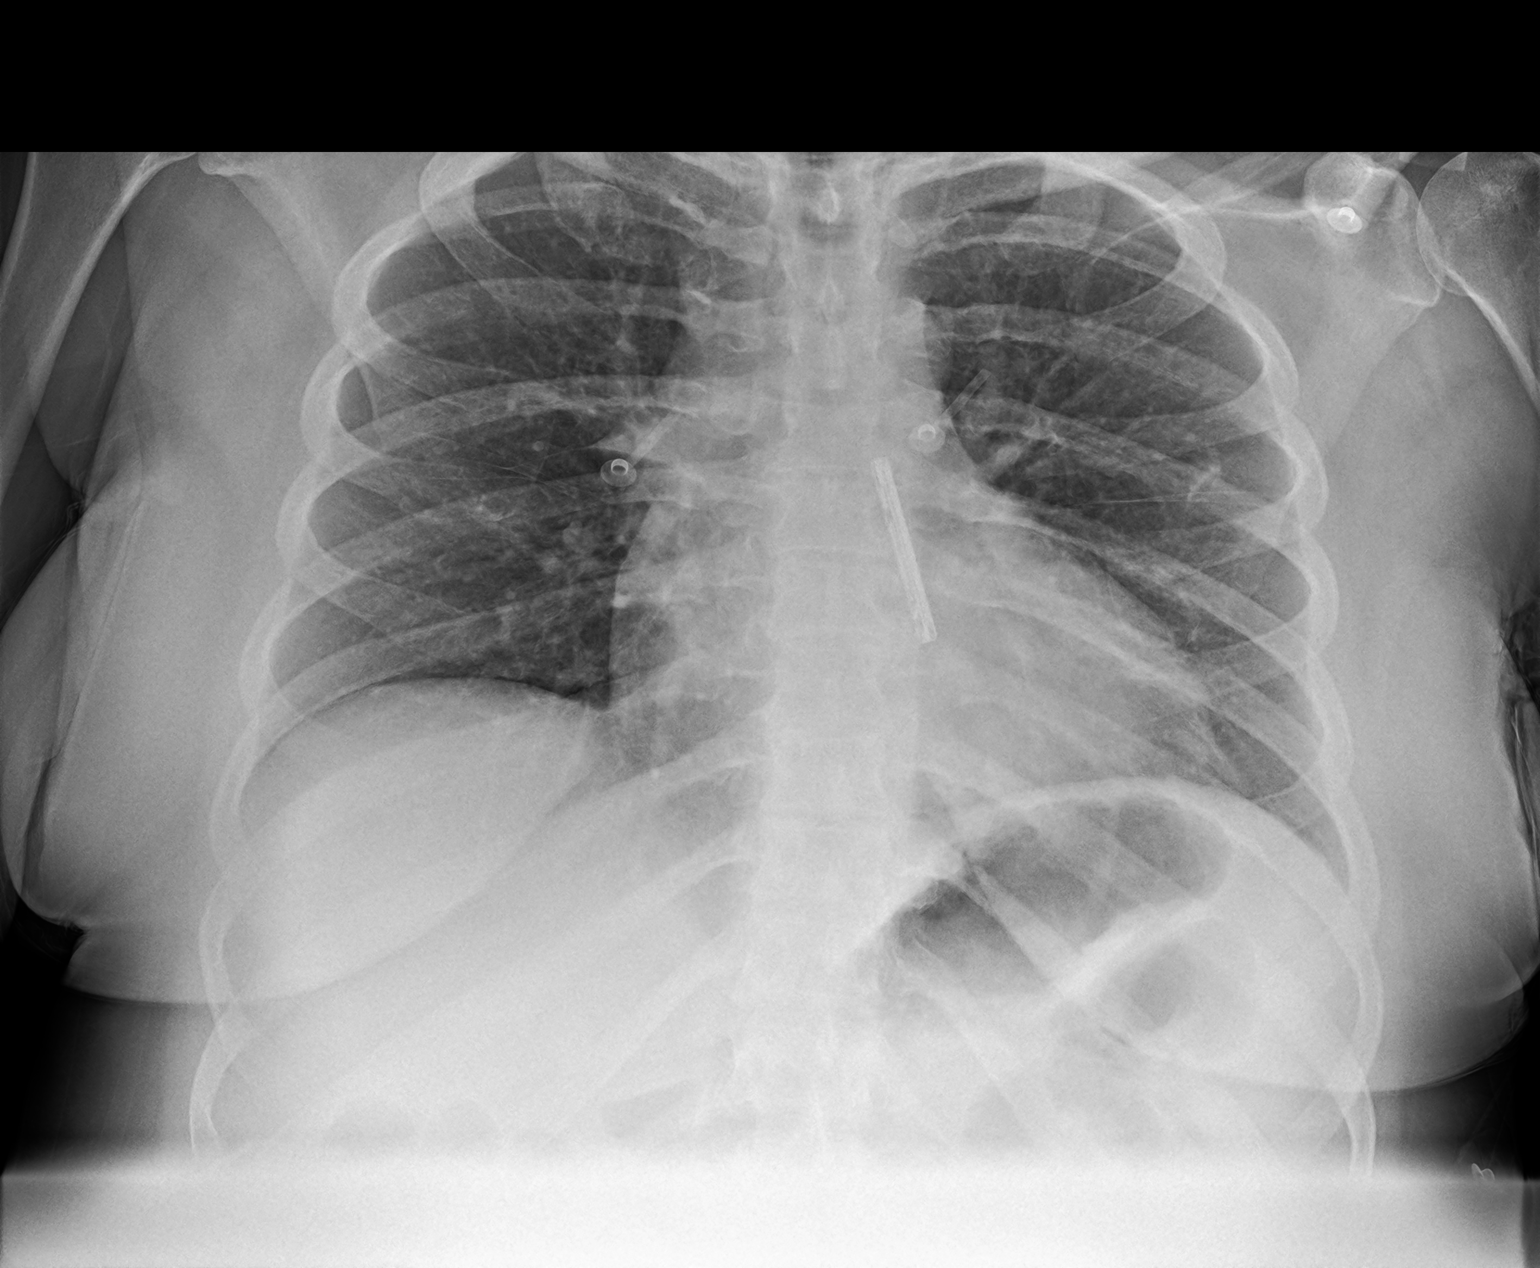

[1 of 1 positions shown; findings below may reference images not displayed]

## 2021-03-08 IMAGING — US US Abdomen LTD
1 series · 13 of 16 positions shown · non-contrast
Comparison: US dated: 10-09-2016. CT dated: 02-09-2021

US Abdomen LTD
INDICATION: Abdominal Pain.                                                              
 Pertinent History: Patient states epigastric pain today with nausea and                   
 vomiting.
TECHNIQUE: Ultrasound evaluation of the RUQ.                                              
 Technologist Comments: None.                                                              
 Limitations: None.

[Series 1: us abdomen ltd · 13 of 73 slices shown]
[im 1/73]
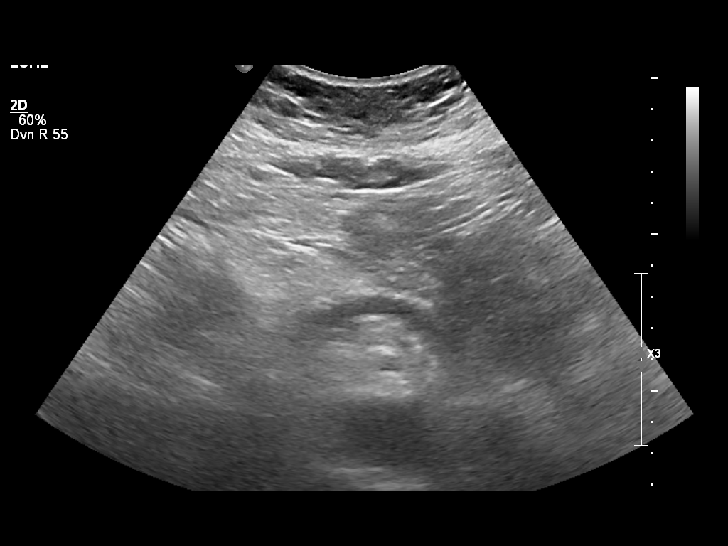
[im 5/73]
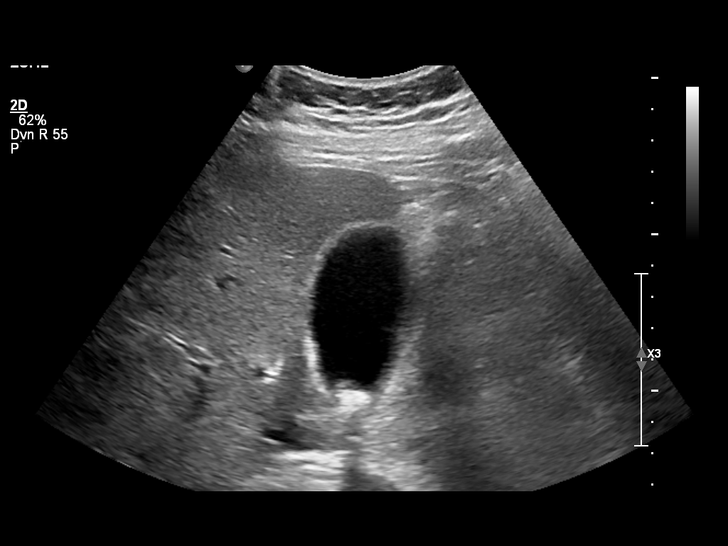
[im 15/73]
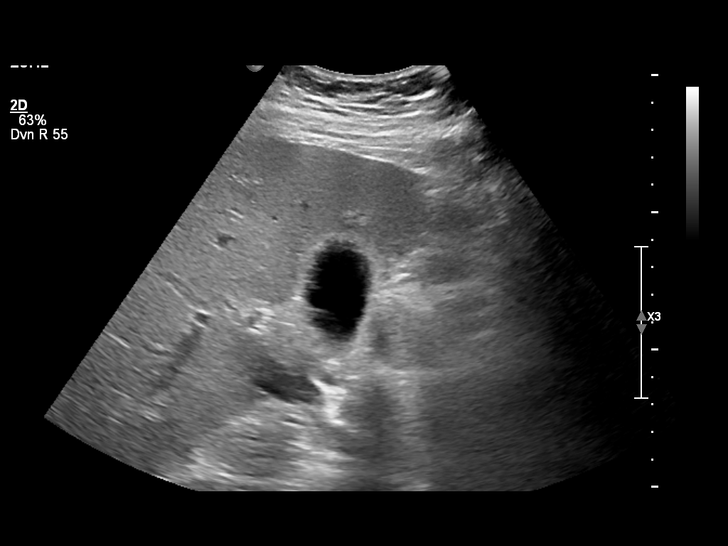
[im 20/73]
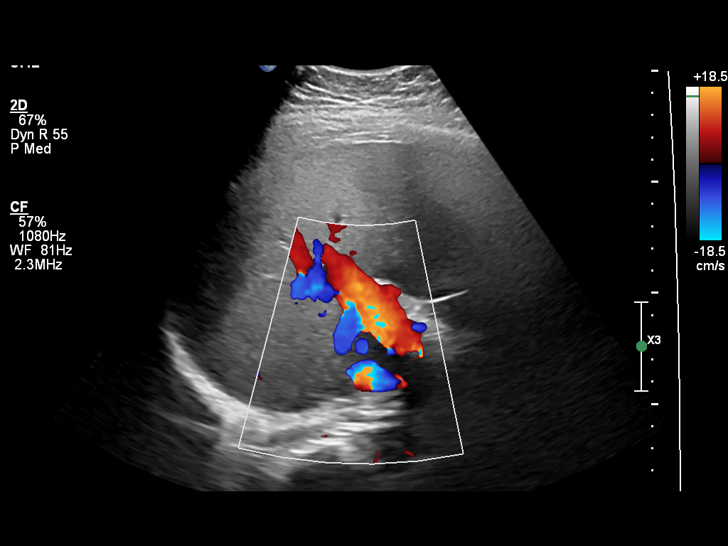
[im 25/73]
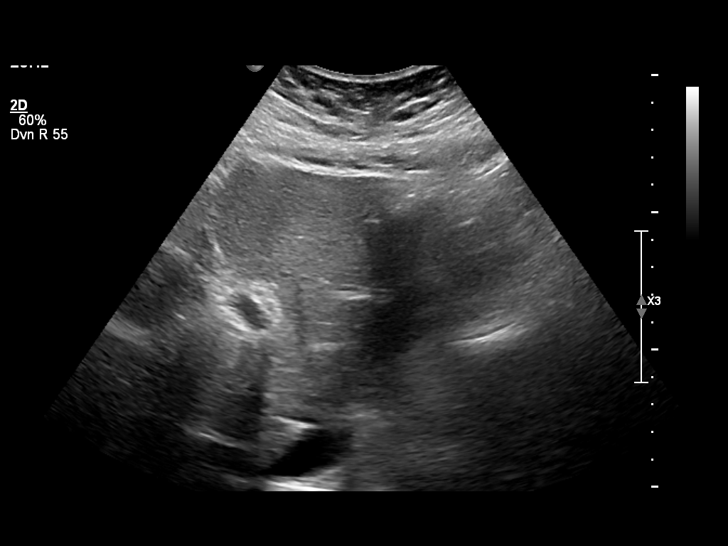
[im 29/73]
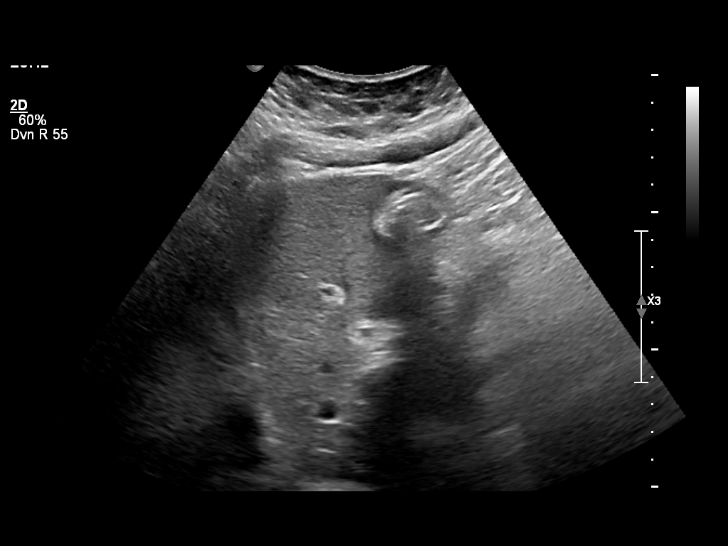
[im 39/73]
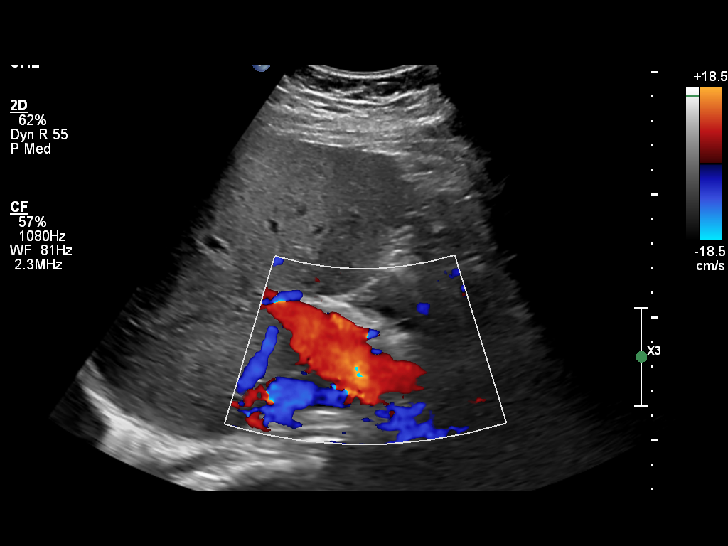
[im 44/73]
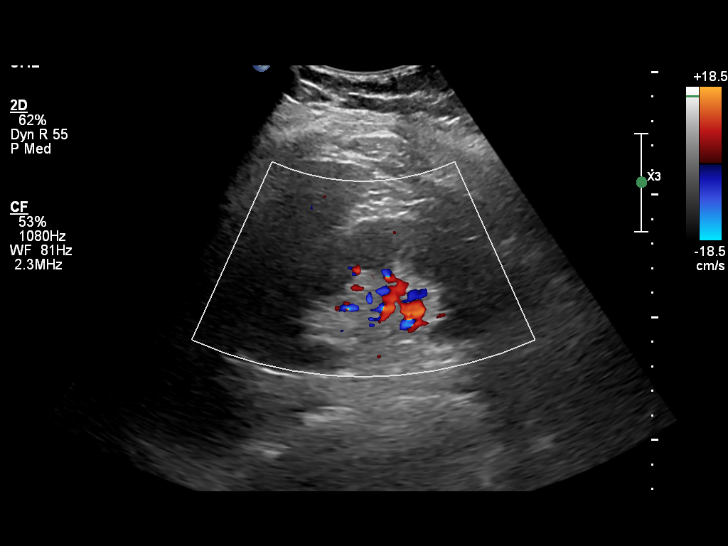
[im 49/73]
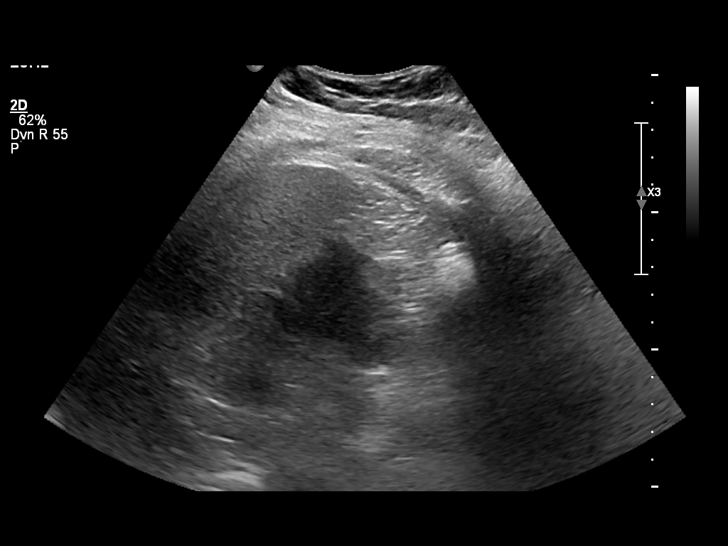
[im 53/73]
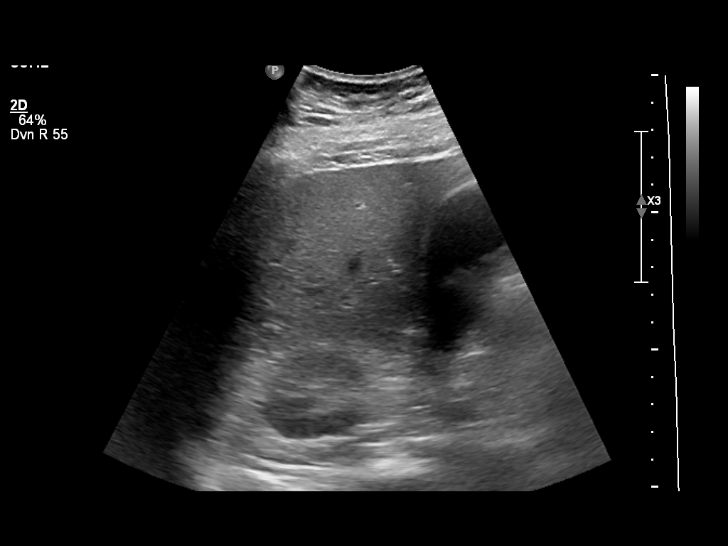
[im 58/73]
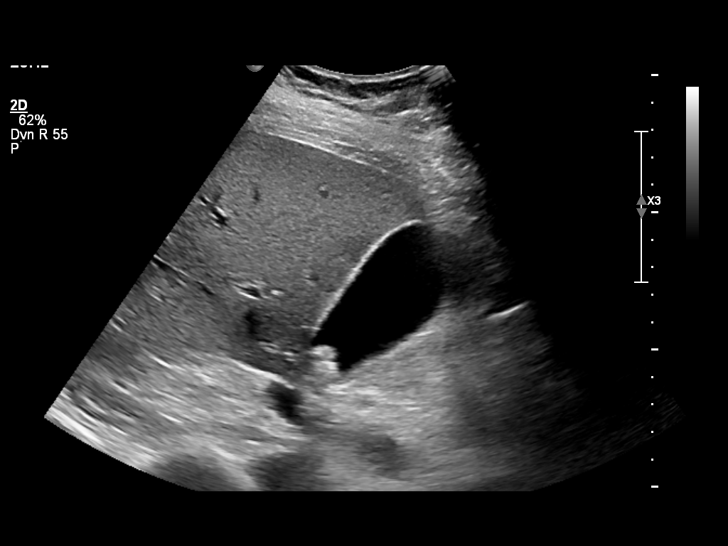
[im 68/73]
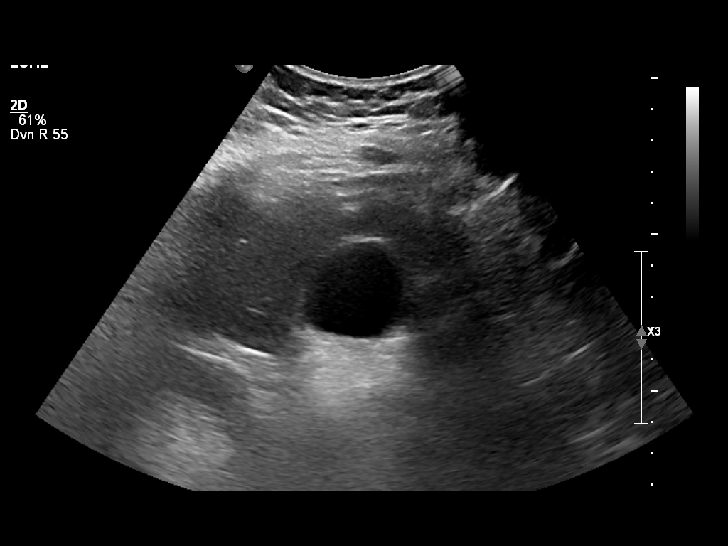
[im 73/73]
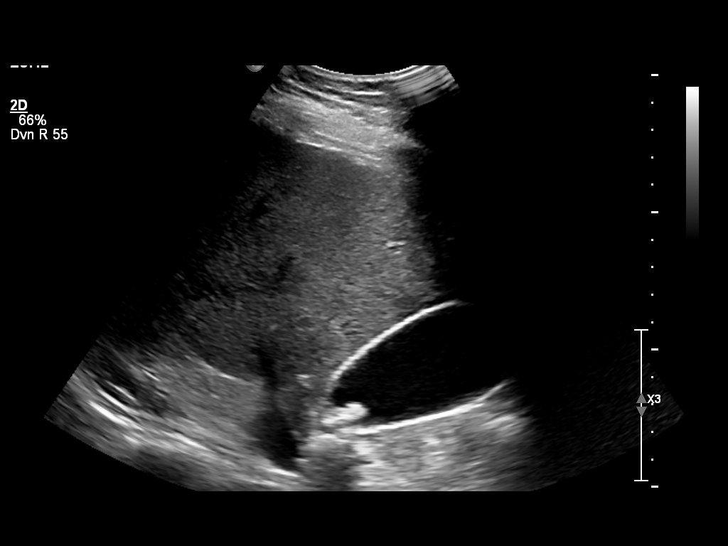

[13 of 16 positions shown; findings below may reference images not displayed]

FINDINGS: PANCREAS                                                                                  
 Normal as visualized.                                                                     
 LIVER                                                                                     
 Parenchyma: Normal                                                                        
 Lesions: None.                                                                            
 Portal vein: Antegrade flow.                                                              
 BILE DUCTS                                                                                
 Common bile duct (mm): 4.65.                                                              
 Intrahepatic bile ducts:  Not dilated.                                                    
 GALLBLADDER                                                                               
 Wall: 2.55 mm Normal wall thickness.                                                      
 Lumen: Multiple gallstones with a non-mobile gallstone within the gallbladder             
 neck.                                                                                     
 Pericholecystic fluid: None.                                                              
 Murphy's sign: Not able to be assessed due to pain medication.                            
 RIGHT KIDNEY                                                                              
 Size (cm): 10.7 cm x 4.2 cm x 5.2 cm.                                                     
 Parenchyma: Normal echogenicity. Normal thickness.                                        
 Lesions: None.                                                                            
 Calculi: None identified.                                                                 
 Hydronephrosis: None.                                                                     
 Free fluid: None identified.                                                              
 Additional findings: None.
IMPRESSION: Multiple gallstones noted. There is a nonmobile gallstone within the gallbladder neck. No 
 gallbladder wall thickening was identified. Sonographic Murphy sign could not be assessed 
 because the patient was on pain medication. No biliary dilatation.

## 2021-03-10 IMAGING — CT CT Abdomen and Pelvis W-Contrast
2 of 3 series · 15 of 46 positions shown, 17 images · IV contrast (omnipaque)
Comparison: none

CT Abdomen and Pelvis W-Contrast
INDICATION: Abdominal Pain, acute, nonlocalized Specific Reason Freetext.                
 Pertinent History: Increased pain since yesterday. Nausea/vomiting. Diabetes,             
 hypertension, CVA-left side deficit.                                                      
 Surgical History: Tubal ligation                                                          
 Cancer: None                                                                              
 GFR (past 30 days): >39 ml/min                                                            
 Lipase: N/A       Amylase: N/A      WBC:
 Intravenous contrast: 100 mL Omnipaque 350                                                
 Oral contrast: No                                                                         
 Technologist Comments: None
TECHNIQUE: Routine with IV contrast. Helical acquisition with sagittal and coronal        
 reformations; post IV contrast images.                                                    
 Utilized dose reduction techniques include: Automated Exposure Control, vendor specific   
 iterative reconstruction technique                                                        
 Peristalsis is made with previous exam from February 09, 2021. Comparison is also made     
 with a chest CT from March 08, 2021.                                                   
 6 mm subpleural nodule laterally in the right lower lobe is similar to study performed 2  
 days ago. Residual mild atelectasis or  infiltrate present posteriorly in the left lower  
 lobe. Slight right basilar atelectasis.                                                   
 Images of the liver, spleen, pancreas and adrenal glands are unremarkable. Images the     
 kidneys demonstrate no hydronephrosis or renal calculi. The aortic caliber is normal. A   
 few upper normal-sized retroperitoneal lymph nodes are seen.                              
 Images the bowel demonstrate no obstruction. The appendix is mildly dilated and           
 fluid-filled measuring up to 1 cm. There is however no surrounding inflammation to        
 suggest acute appendicitis. There are diverticula in the sigmoid region but no evidence   
 of acute diverticulitis. Cystic change present in the right ovary with 2.8 cm cyst, new   
 from previous and probably prominent follicle. The previously noted 3.5 cm cystic lesion  
 in the left ovary has resolved. No free fluid is present. Wires. Tubal ligation           
 present.

[Series 3: ax post · axial · 0.84mm/px · z∈[-300,+115]mm · 12 of 97 slices shown, 14 images]
[im 7/97  soft-tissue]
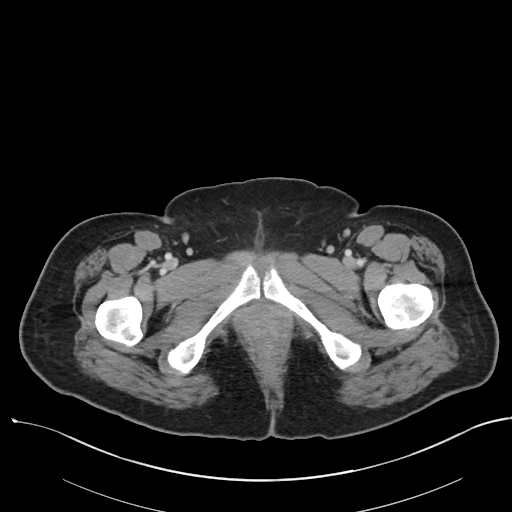
[im 7/97  bone]
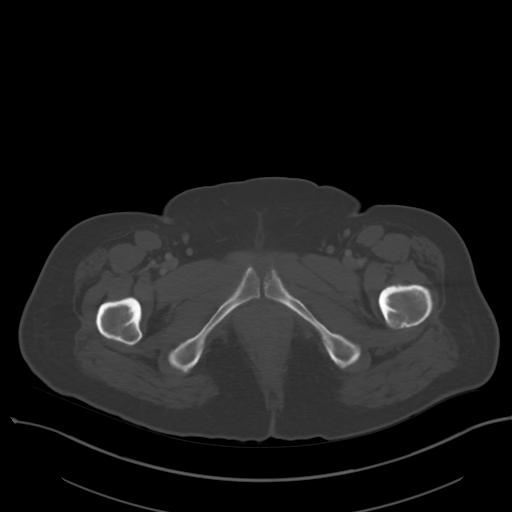
[im 13/97  soft-tissue]
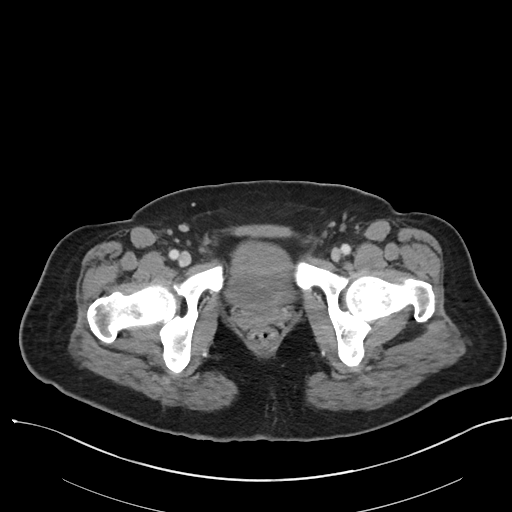
[im 22/97  soft-tissue]
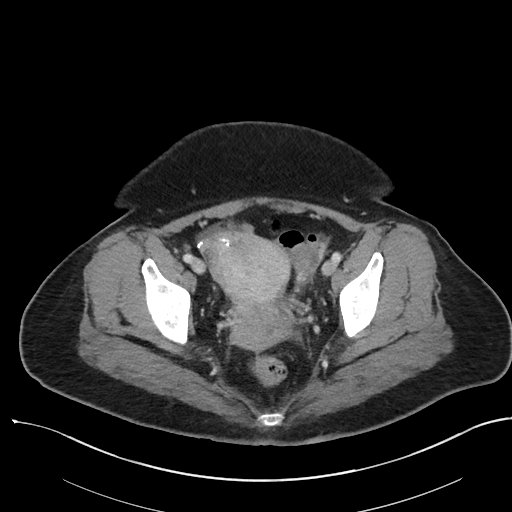
[im 28/97  soft-tissue]
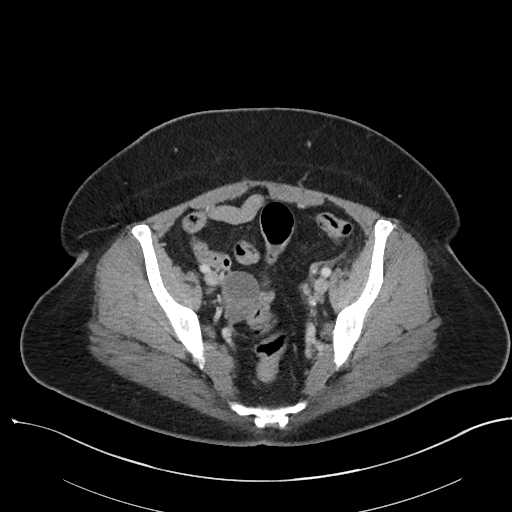
[im 38/97  soft-tissue]
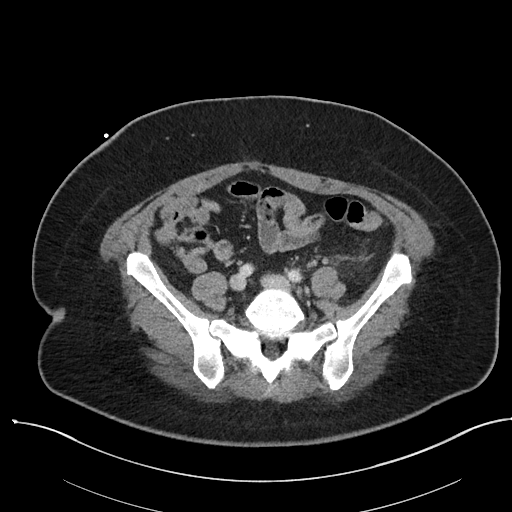
[im 44/97  soft-tissue]
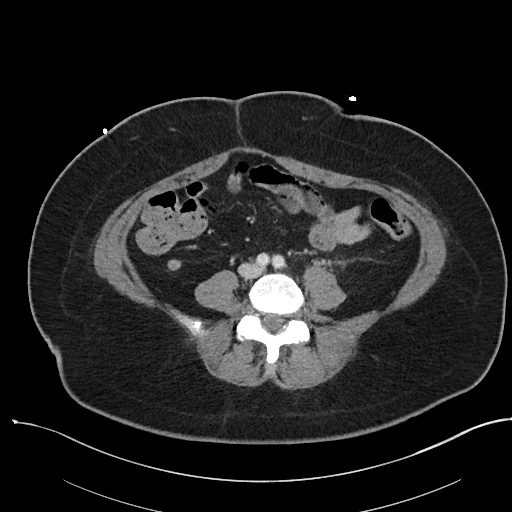
[im 53/97  soft-tissue]
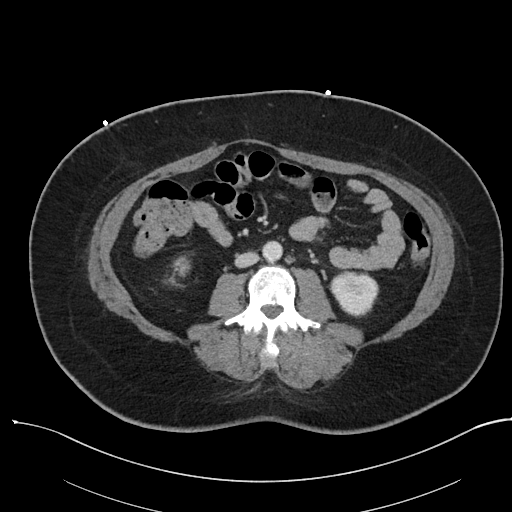
[im 59/97  soft-tissue]
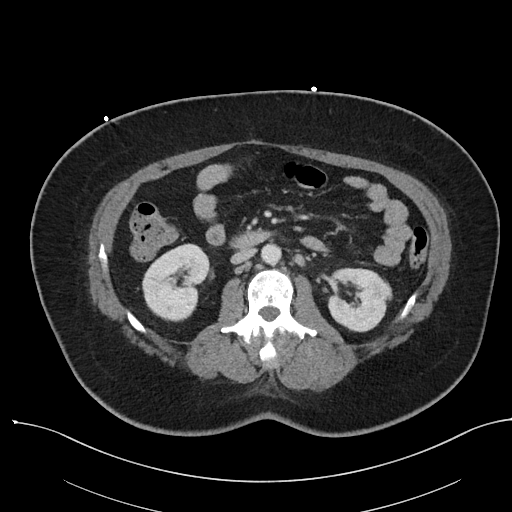
[im 69/97  soft-tissue]
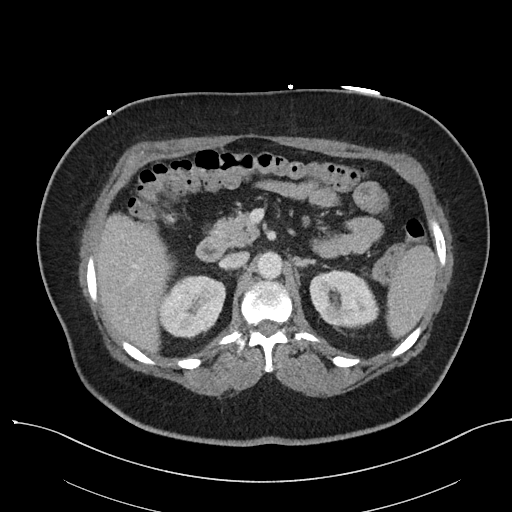
[im 69/97  bone]
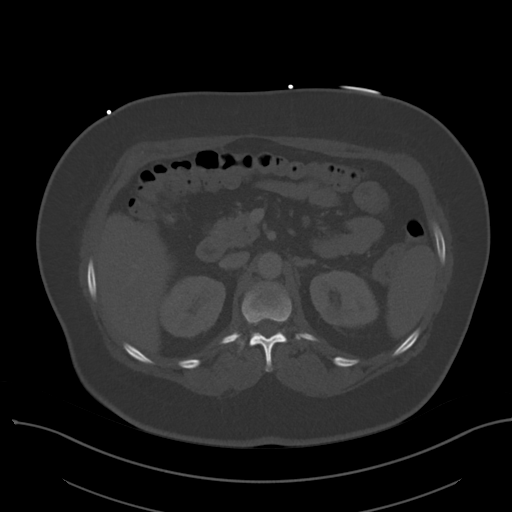
[im 75/97  soft-tissue]
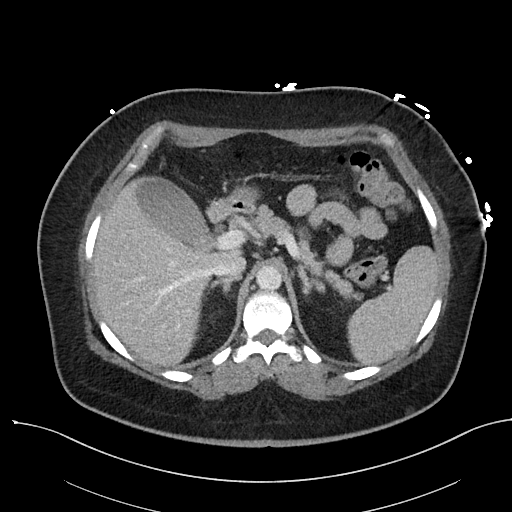
[im 84/97  soft-tissue]
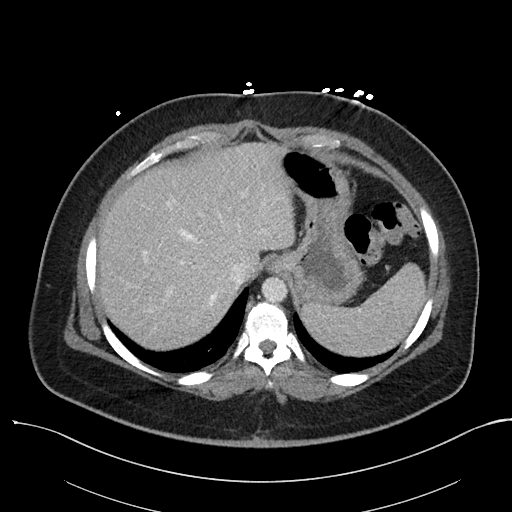
[im 90/97  soft-tissue]
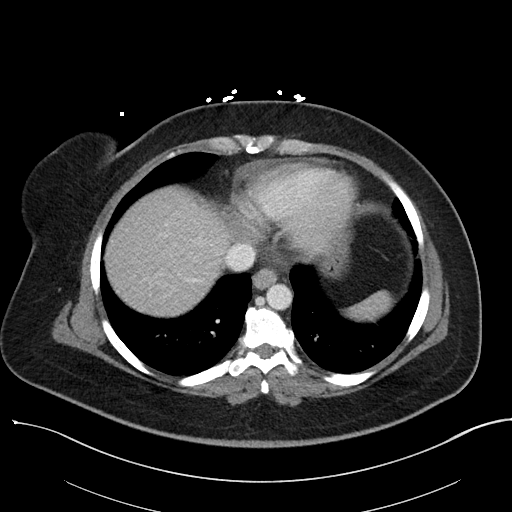

[Series 4: cor post · coronal · 0.80mm/px · 3 of 60 slices shown]
[im 20/60  soft-tissue]
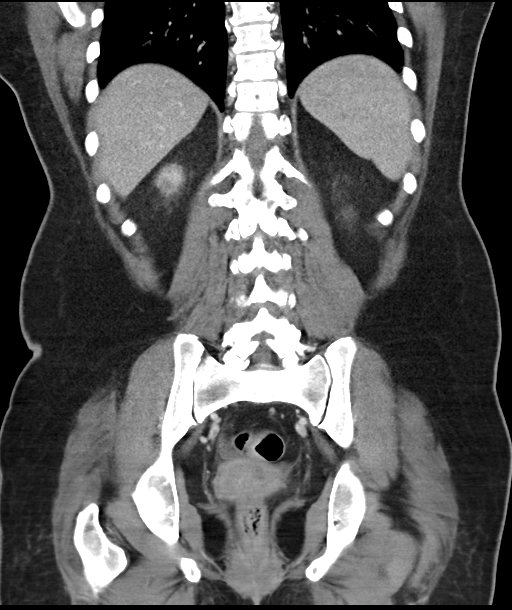
[im 27/60  soft-tissue]
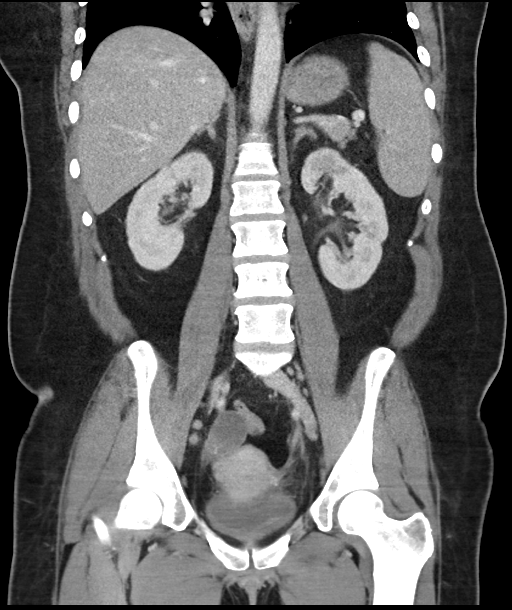
[im 33/60  soft-tissue]
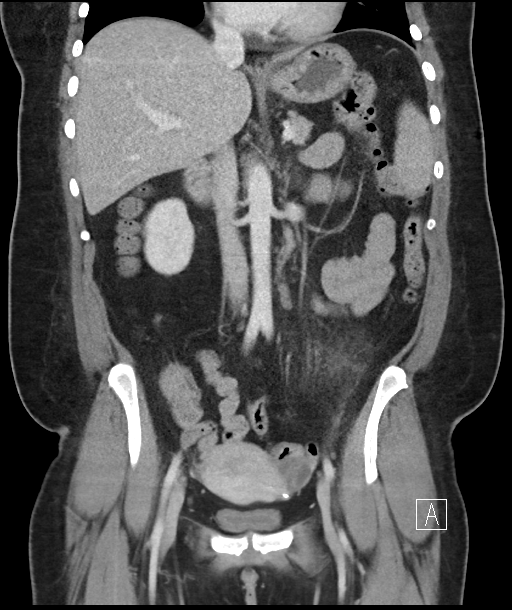

[15 of 46 positions shown; findings below may reference images not displayed]

IMPRESSION: 1. The appendix is fluid-filled and mildly dilated measuring up to 1 cm. There is however 
 no surrounding inflammation to suggest acute appendicitis..                               
 2. Diverticulosis without evidence of acute diverticulitis.                               
 3. New 2.8 cm probable prominent follicle right ovary.

## 2021-03-23 IMAGING — MR MR Brain W W-O Contrast
9 of 15 series · 11 of 48 positions shown · IV contrast (DOTAREM)
Comparison: None.

EXAM: MR Brain W W-O Contrast                                                             
 DATE: March 23, 2021
INDICATION: Concern for stroke
TECHNIQUE: Multiple routine MRI pulse sequences of the brain were obtained with and       
 without contrast. After the administration of  19 ml of Dotarem IV contrast.

[Series 5: DWI · axial · 5.0mm · 0.60mm/px · z∈[-47,+95]mm · 3 of 50 slices shown (1 of 2)]
[im 1/50]
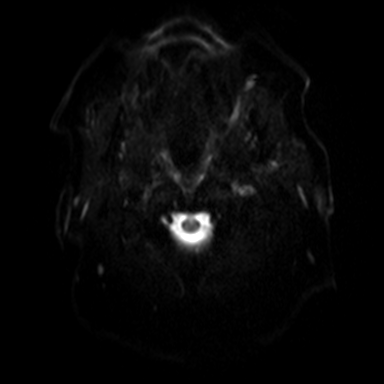
[im 25/50]
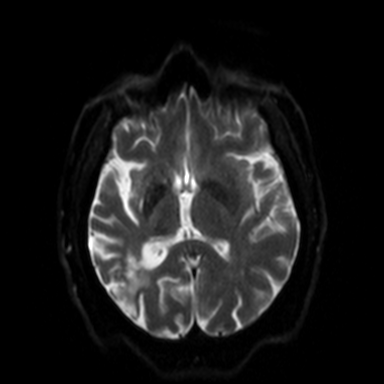
[im 50/50]
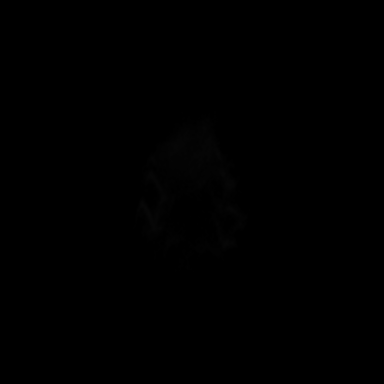

[Series 6: DWI · axial · 5.0mm · 0.60mm/px · 1 of 25 slices shown (2 of 2)]
[im 1/25]
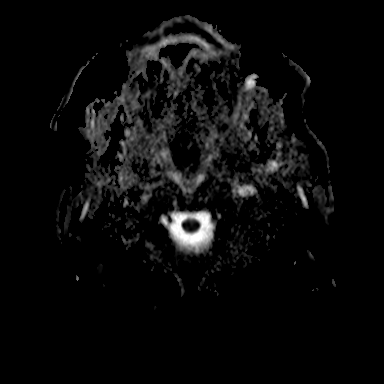

[Series 7: FLAIR · sagittal · 5.0mm · 0.38mm/px · 1 of 23 slices shown (1 of 2)]
[im 1/23]
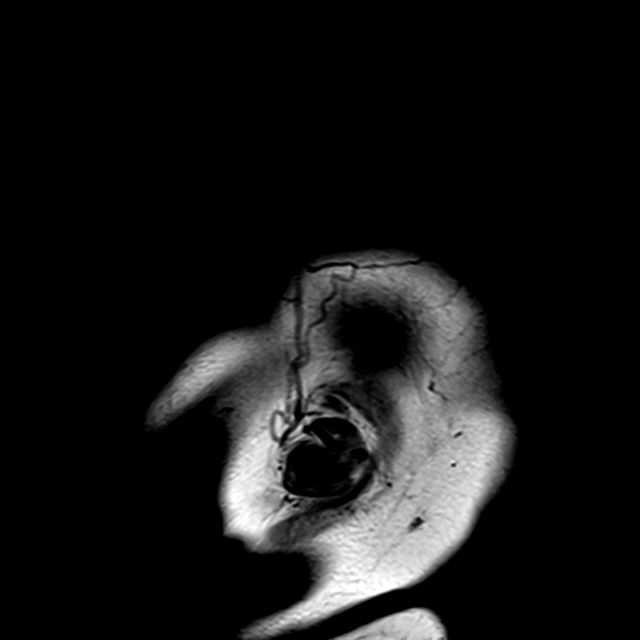

[Series 8: FLAIR · axial · 5.0mm · 0.45mm/px · 1 of 25 slices shown (2 of 2)]
[im 1/25]
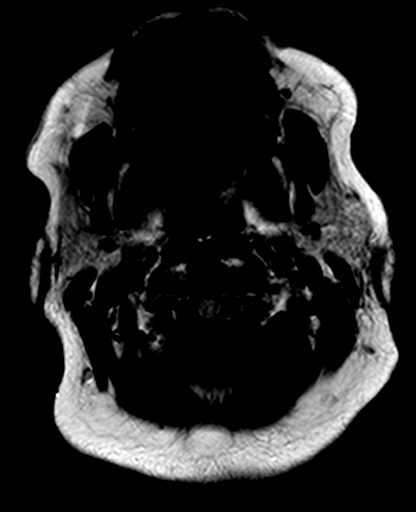

[Series 9: T2 · axial · 5.0mm · 0.26mm/px · 1 of 25 slices shown]
[im 1/25]
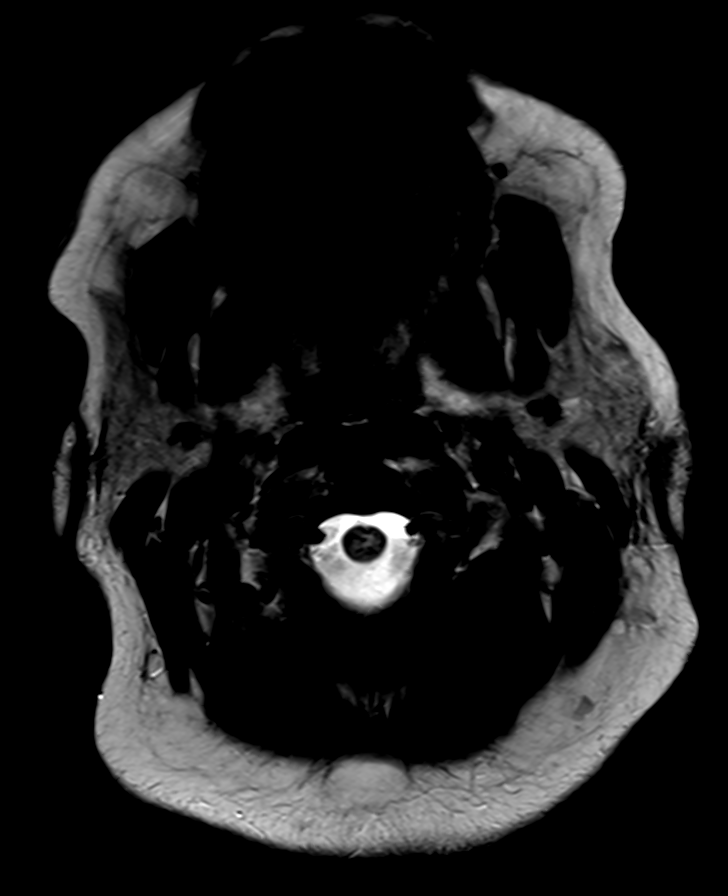

[Series 10: ax gr · axial · 5.0mm · 0.45mm/px · 1 of 25 slices shown]
[im 1/25]
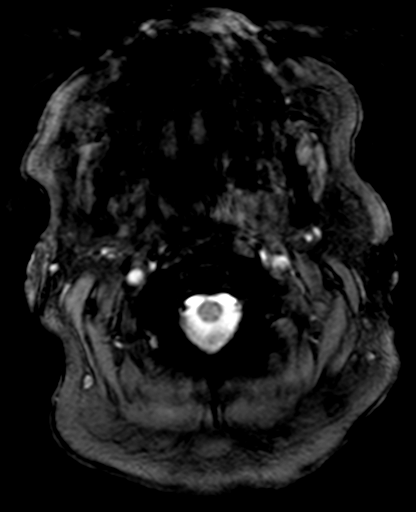

[Series 11: T1 · axial · 5.0mm · 0.72mm/px · 1 of 25 slices shown]
[im 1/25]
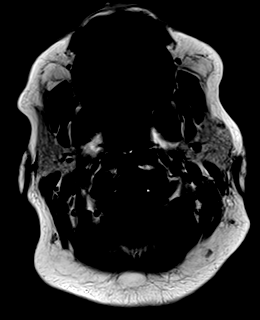

[Series 12: T1 fat-sat post-contrast · coronal · 5.0mm · 0.72mm/px · 1 of 26 slices shown (1 of 2)]
[im 1/26]
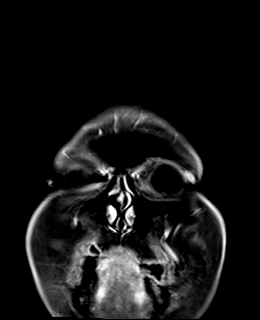

[Series 16: T1 fat-sat post-contrast · axial · 5.0mm · 0.72mm/px · 1 of 25 slices shown (2 of 2)]
[im 1/25]
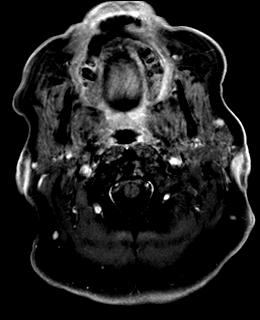

[11 of 48 positions shown; findings below may reference images not displayed]

FINDINGS: There appears to be very subtle diffusion restriction within the right basal ganglia      
 image 39 series 5. Encephalomalacia within the right frontoparietal lobe. There is subtle 
 enhancement within the right caudate, right lentiform nucleus, and likely the right       
 internal capsule. Multiple sequences on this exam are limited by motion artifact.         
 Calvarium is unremarkable. Globes and orbits are grossly unremarkable. Tracing coastal    
 thickening of paranasal sinuses.
IMPRESSION: 1. Abnormal enhancement of the right basal ganglia as detailed above. There appears to be 
 associated very subtle diffusion restriction. Findings would favor a subacute infarct.    
 Infectious or inflammatory cerebritis could produce a similar appearance.                 
 2. Remote right MCA dislocation infarction.

## 2021-04-29 NOTE — L&D Delivery Note (Signed)
Delivery Note    Obstetrician:  Moody Bruins, MD    Assistant: none    Pre-Delivery Diagnosis: Spontaneous labor, Single fetus, and 40.4 wks    Post-Delivery Diagnosis: Living newborn infant(s) and Female    Intrapartum Event: None    Procedure: Spontaneous vaginal delivery Delivery Time 1633    Epidural: no    Monitor:  Fetal Heart Tones - Internal and Uterine Contractions - External    Indications for instrumental delivery: none    Estimated Blood Loss: 300 cc    Episiotomy: n/a    Laceration(s):  2nd degree and vaginal    Laceration(s) repair: Yes    Presentation: Cephalic    Fetal Description: singleton    Fetal Position: Left Occiput Anterior    Birth Weight: 7 lbs 2 oz (3220 gm)    Birth Length: (20 inches) 51 cm     Apgar - One Minute: 8    Apgar - Five Minutes: 9    Umbilical Cord: 3 vessels present    Specimens: Placenta           Complications:  none           Cord Blood Results:   Information for the patient's newborn:  Timira, Bieda [[id number]]   No results found for: "PCTDIG", "BILI"   Prenatal Labs:     Lab Results   Component Value Date/Time    ABORH O Rh Positive 01/22/2022 11:25 AM        Attending Attestation: I was present and scrubbed for the key portions of the procedure.    Signed By:  Moody Bruins, MD     January 22, 2022

## 2021-04-30 IMAGING — CT CT Brain W-O Contrast
3 of 4 series · 14 of 47 positions shown, 16 images · non-contrast
Comparison: March 28, 2021 and MRI March 23, 2021

CT Brain W-O Contrast
INDICATION: Slurred Speech hx cva.                                                       
 Pertinent History: CVA-like symptoms. Patient states she has been having slurred speech   
 for 2 days.hx of CVA                                                                      
 Surgical History:                                                                         
 Cancer: None                                                                              
 Technologist Comments: None.
TECHNIQUE: Helical acquisition with sagittal and coronal reformats.                       
 Utilized dose reduction techniques include: Automated Exposure Control, vendor specific   
 iterative reconstruction technique

[Series 4: cor pre · coronal · non-contrast · 0.34mm/px · 3 of 80 slices shown]
[im 27/80  brain]
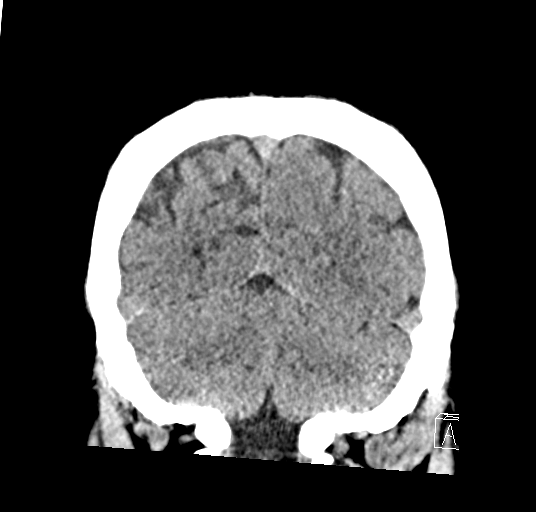
[im 36/80  brain]
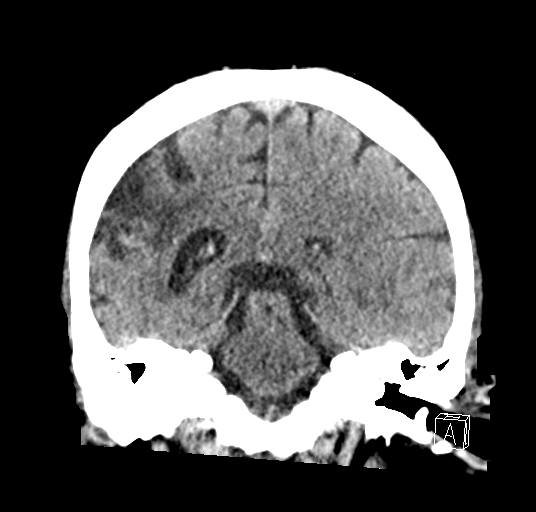
[im 44/80  brain]
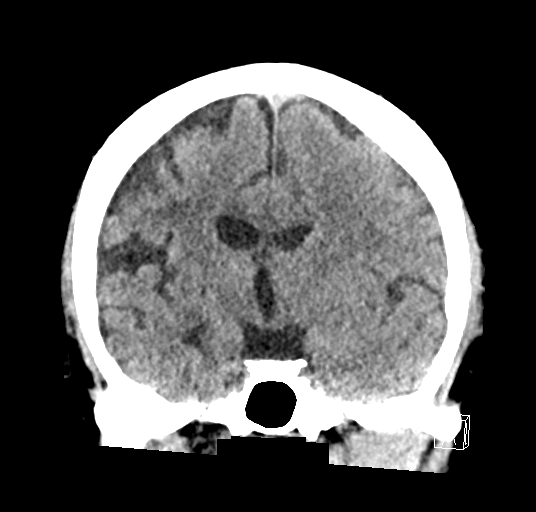

[Series 5: sag pre · sagittal · non-contrast · 0.36mm/px · 3 of 60 slices shown]
[im 20/60  brain]
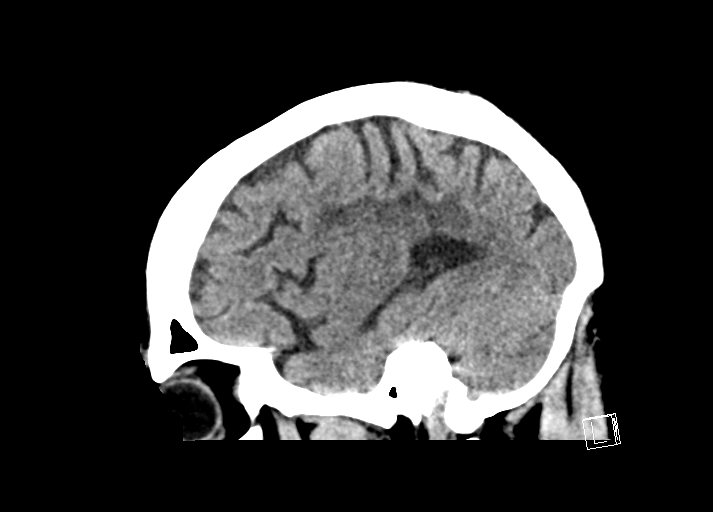
[im 30/60  brain]
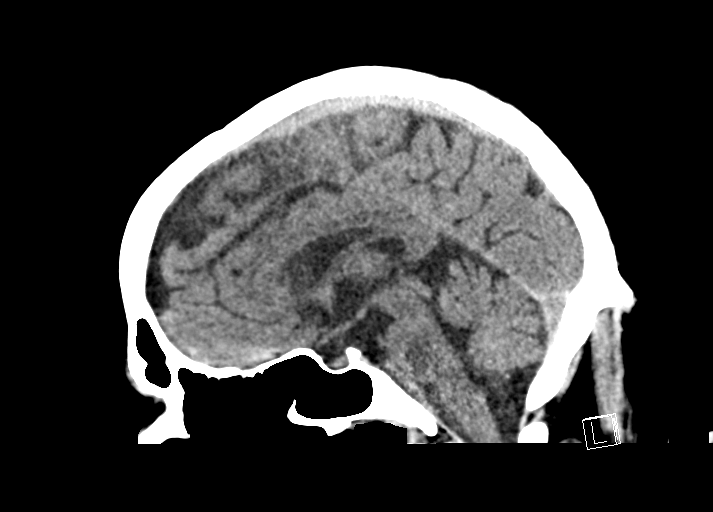
[im 40/60  brain]
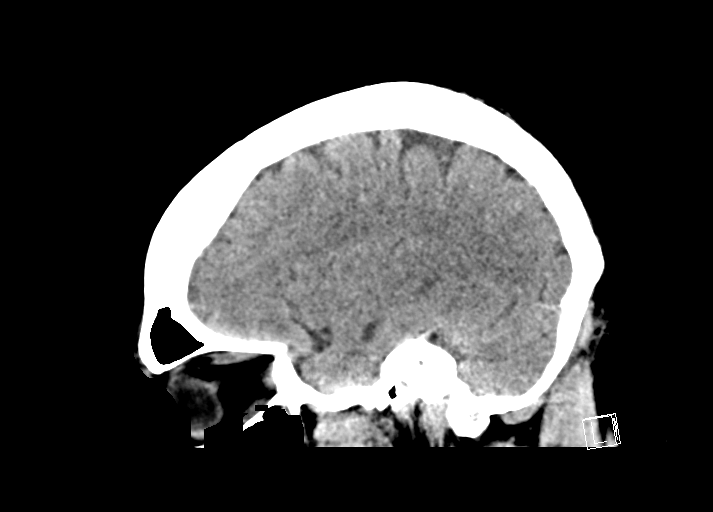

[Series 6: corr axial · axial · 0.45mm/px · z∈[+1283,+1431]mm · 8 of 58 slices shown, 10 images]
[im 4/58  brain]
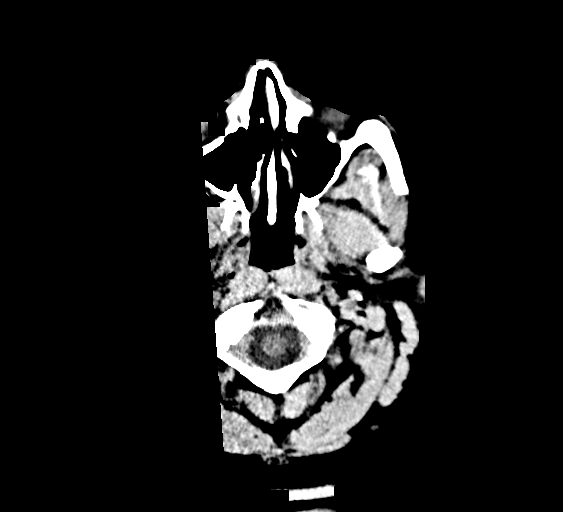
[im 4/58  bone]
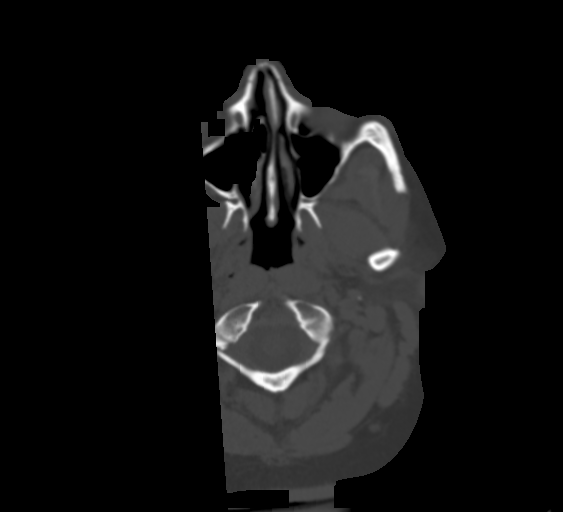
[im 12/58  brain]
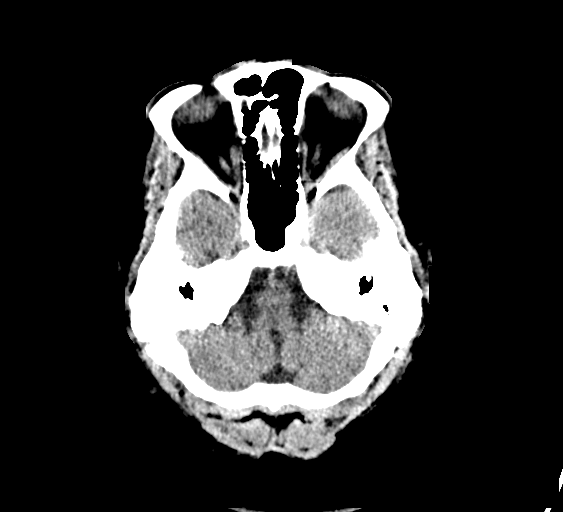
[im 20/58  brain]
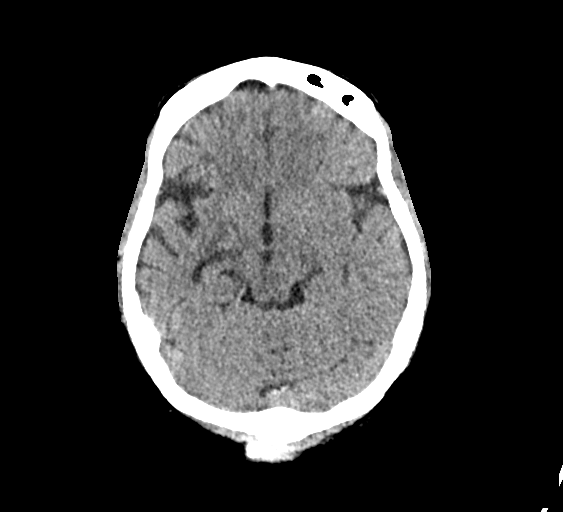
[im 27/58  brain]
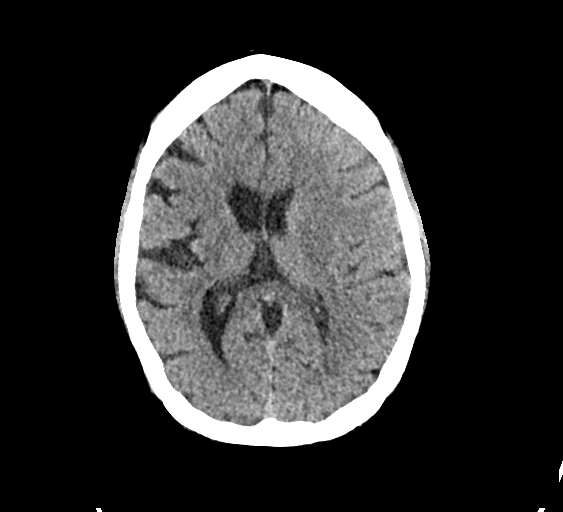
[im 31/58  brain]
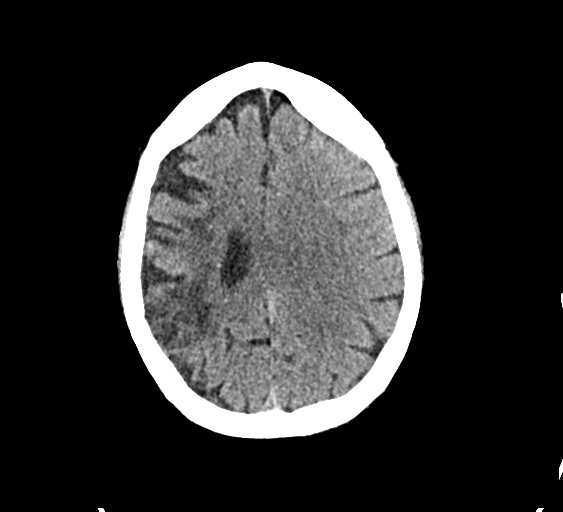
[im 31/58  bone]
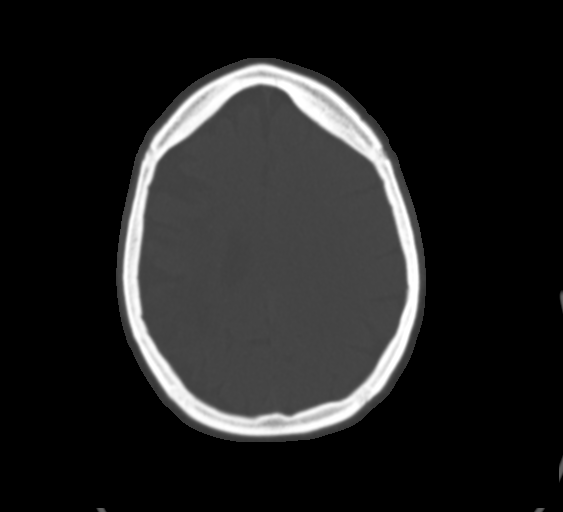
[im 39/58  brain]
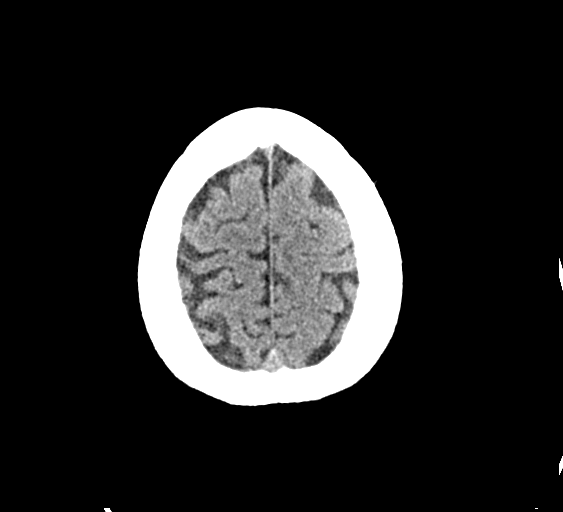
[im 46/58  brain]
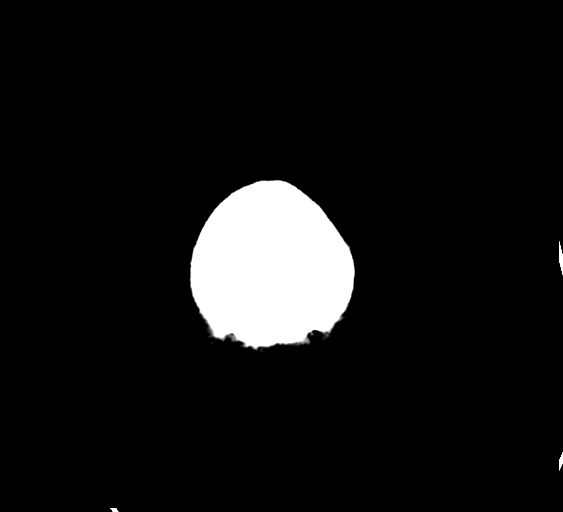
[im 54/58  brain]
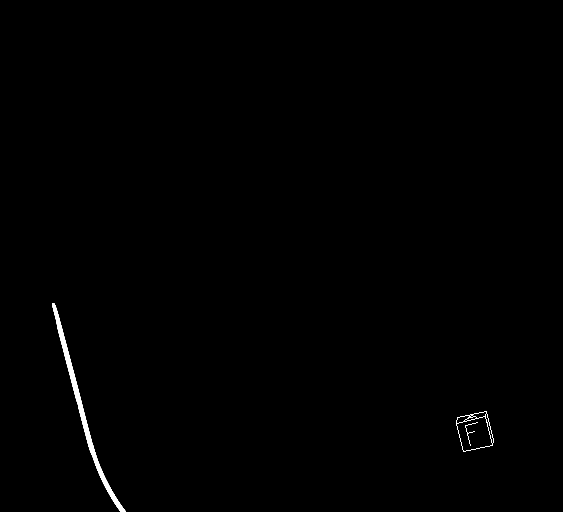

[14 of 47 positions shown; findings below may reference images not displayed]

FINDINGS: No acute intracranial hemorrhage, mass effect, midline shift, abnormal extra-axial fluid  
 collection or definite acute territorial infarct. Negative for hydrocephalus.             
 Moderate right MCA distribution remote infarct and encephalomalacia are redemonstrated    
 with associated volume loss. Remote lacunar infarct of the right caudate                  
 nucleus.                                                                                  
 Calvarium is intact. Paranasal sinuses and mastoid air cells are clear. Soft tissues are  
 unremarkable.                                                                             
 Key: (S/I) = series number / image number                                                 
 ____________
IMPRESSION: 1.  No convincing acute intracranial abnormality is identified on CT. MRI would provide   
 more sensitive evaluation.                                                                
 2.  Remote encephalomalacia of the right MCA distribution and a lacunar infarct of the    
 right caudate nucleus.

## 2021-05-01 IMAGING — MR MR Brain W-O Contrast
10 of 18 series · 22 of 48 positions shown · IV contrast (gadolinium)
Comparison: Head CT April 30, 2021

HISTORY: Facial droop. Slurred speech.
TECHNIQUE: Multiplanar and multisequence MR imaging of the brain without IV               
 gadolinium

[Series 9: DWI · axial · 5.0mm · 1.20mm/px · z∈[-116,+35]mm · 4 of 54 slices shown (1 of 2)]
[im 1/54]
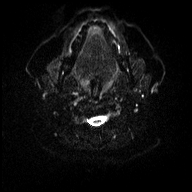
[im 18/54]
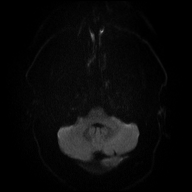
[im 36/54]
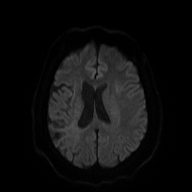
[im 54/54]
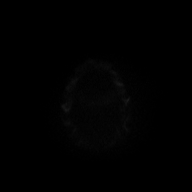

[Series 10: DWI · axial · 5.0mm · 1.20mm/px · z∈[-116,+35]mm · 2 of 27 slices shown (2 of 2)]
[im 1/27]
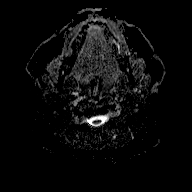
[im 27/27]
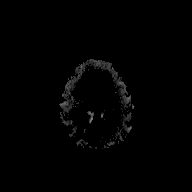

[Series 11: T1 · sagittal · 5.0mm · 0.49mm/px · 2 of 23 slices shown (1 of 2)]
[im 1/23]
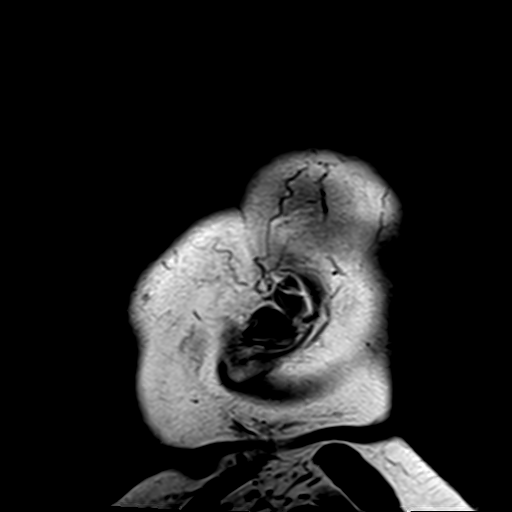
[im 23/23]
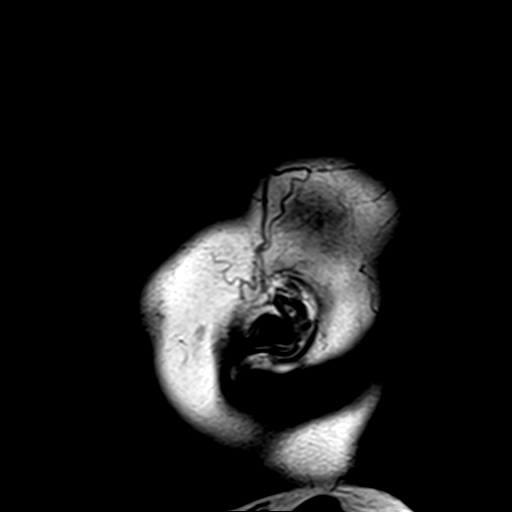

[Series 12: FLAIR · axial · 5.0mm · 0.45mm/px · z∈[-112,+39]mm · 2 of 27 slices shown (1 of 4)]
[im 1/27]
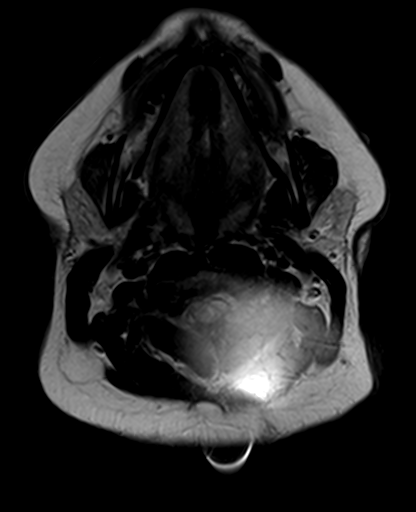
[im 27/27]
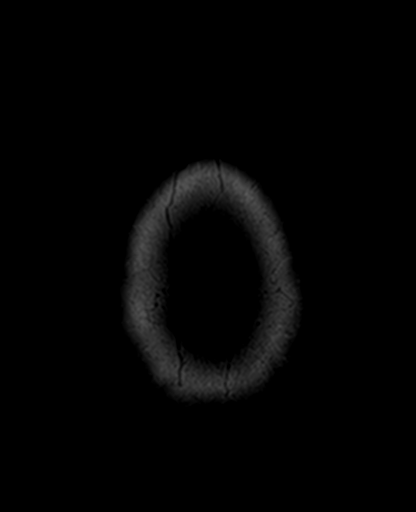

[Series 13: T2 · axial · 5.0mm · 0.30mm/px · z∈[-112,+39]mm · 2 of 27 slices shown]
[im 1/27]
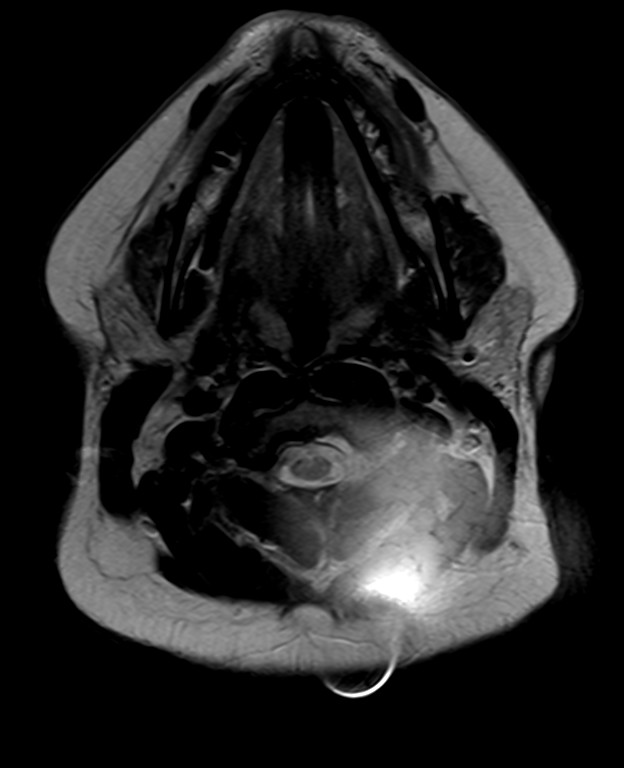
[im 27/27]
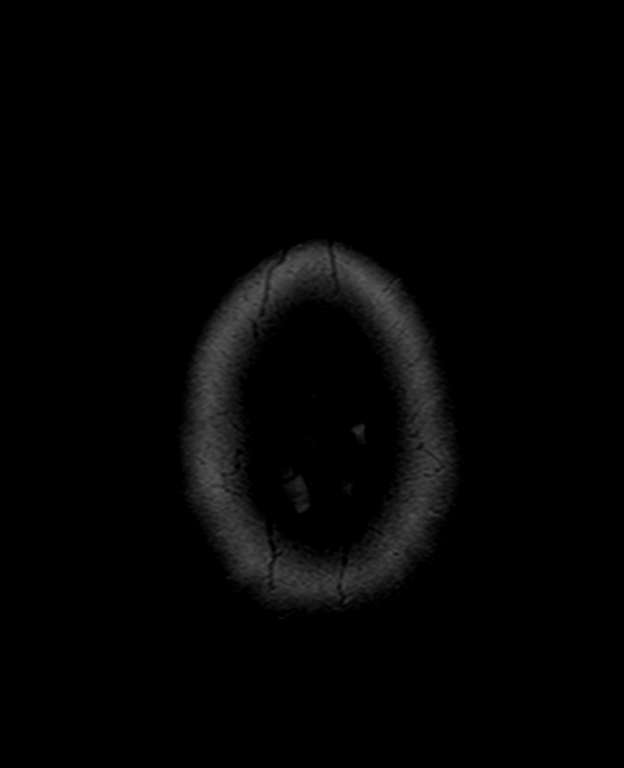

[Series 14: ax gr · axial · 5.0mm · 0.45mm/px · z∈[-112,+39]mm · 2 of 27 slices shown]
[im 1/27]
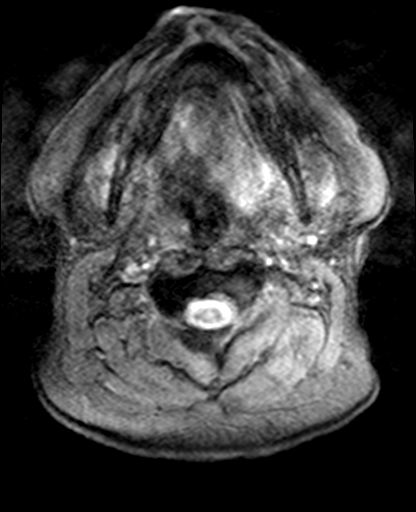
[im 27/27]
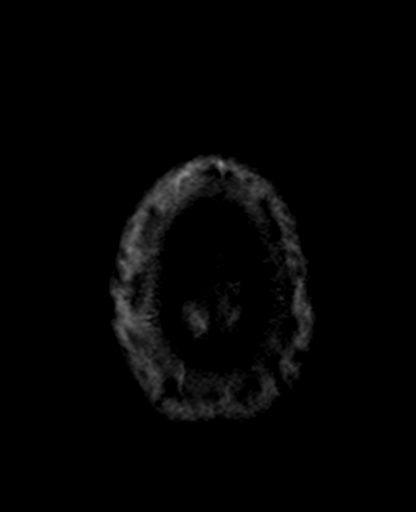

[Series 15: FLAIR · coronal · 5.0mm · 0.45mm/px · 2 of 30 slices shown (2 of 4)]
[im 1/30]
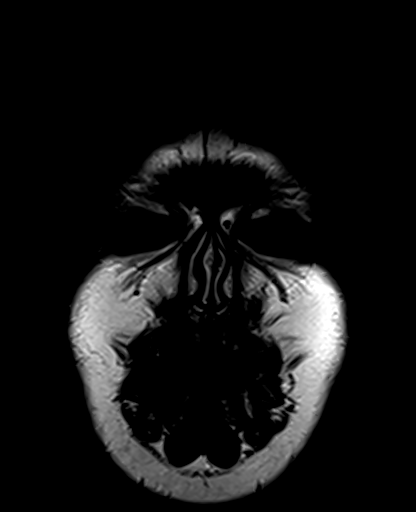
[im 30/30]
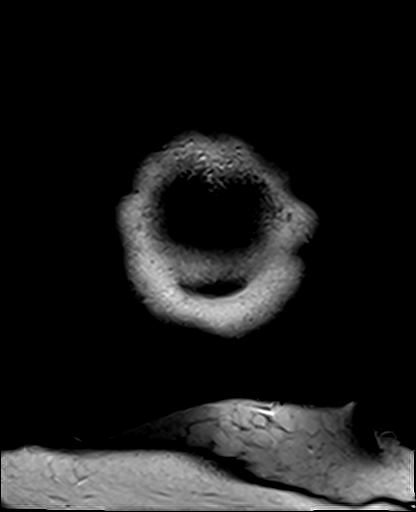

[Series 16: T1 · axial · 5.0mm · 0.72mm/px · z∈[-112,+39]mm · 2 of 27 slices shown (2 of 2)]
[im 1/27]
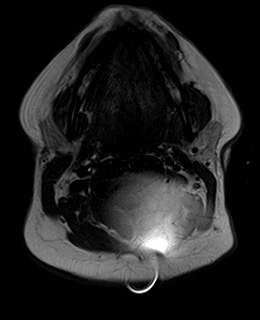
[im 27/27]
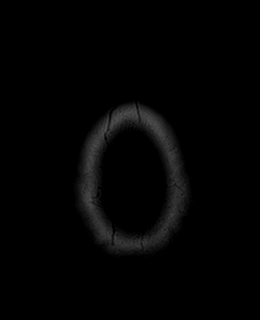

[Series 17: FLAIR · axial · 5.0mm · 0.90mm/px · z∈[-116,+35]mm · 2 of 27 slices shown (3 of 4)]
[im 1/27]
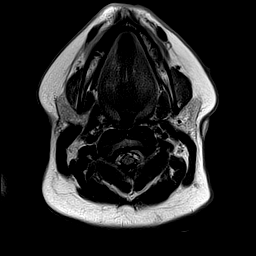
[im 27/27]
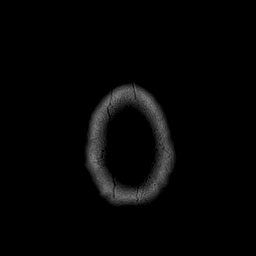

[Series 18: FLAIR · sagittal · 5.0mm · 0.98mm/px · 2 of 23 slices shown (4 of 4)]
[im 1/23]
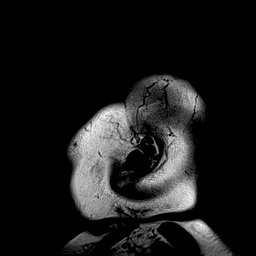
[im 23/23]
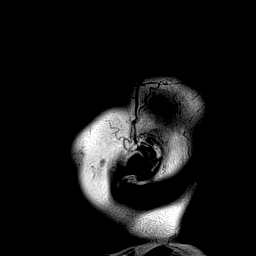

[22 of 48 positions shown; findings below may reference images not displayed]

FINDINGS: Review of diffusion weighted imaging reveals no acute infarct. No findings to indicate    
 chronic blood product deposition. No signal abnormality to indicate intracranial          
 mass.                                                                                     
 The examination is limited by motion artifact. The midline craniocervical structures      
 appear unremarkable. No parenchymal hemorrhage or extra-axial fluid collection. There is  
 encephalomalacia and gliosis in the distribution of the right MCA at the right parietal   
 lobe and mildly at the right occipital and frontal lobes, suggesting remote ischemic      
 changes. Mild ex vacuo dilation of the right lateral ventricle. Remote right basal        
 ganglion lacunar infarcts. Small foci of T2/FLAIR signal hyperintensity at the left       
 frontal centrum semiovale. Chronic atrophy of the right aspect of the midbrain.           
 Trace bilateral mastoid effusions. Mucous retention cyst at the left maxillary sinus.     
 Trace mucosal thickening within bilateral ethmoid air cells. Mucous retention cyst at the 
 right ethmoidofrontal recesses.
IMPRESSION: Chronic ischemic changes as above without signal abnormality to indicate mass,            
 hemorrhage, or acute infarct.

## 2021-05-18 IMAGING — CT CTA Chest-PE
2 of 6 series · 14 of 36 positions shown · IV contrast (omnipaque)
Comparison: CT chest 04/10/2021

CTA Chest-PE
INDICATION: Specific Reason Freetext PE suspected, high pretest prob.                    
 Pertinent History: pt arrived to ED via EMS reporting of having sudden onset of CP. Pt    
 states she was watching tv when she had sudden onset of mid sternal CP. pt states pain is 
 a sharp dull sensation.                                                                   
 Surgical History:                                                                         
 Cancer: None                                                                              
 GFR (past 30 days): Not applicable                                                        
 Smoking status:                                                                           
 Intravenous contrast: 70 mL Omnipaque 350                                                 
 Technologist Comments: None
TECHNIQUE: Computed tomographic pulmonary angiography; helical acquisition without        
 cardiac gating; empiric delay for contrast timing; 3D angiographic MIP (maximum intensity 
 projection) reconstructions; sagittal and coronal reformats.                              
 Utilized dose reduction techniques include: Automated Exposure Control, vendor specific   
 iterative reconstruction technique

[Series 4: cor post chest · coronal · 0.50mm/px · 3 of 141 slices shown]
[im 29/141  mediastinal]
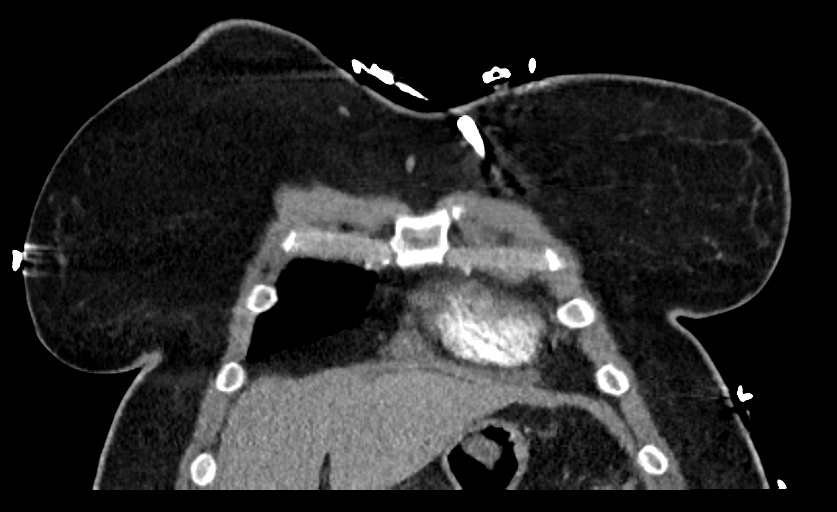
[im 57/141  mediastinal]
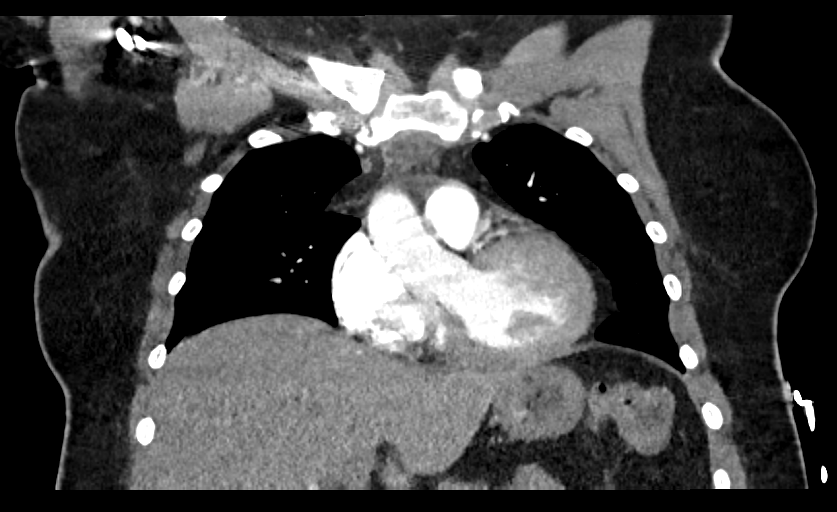
[im 85/141  mediastinal]
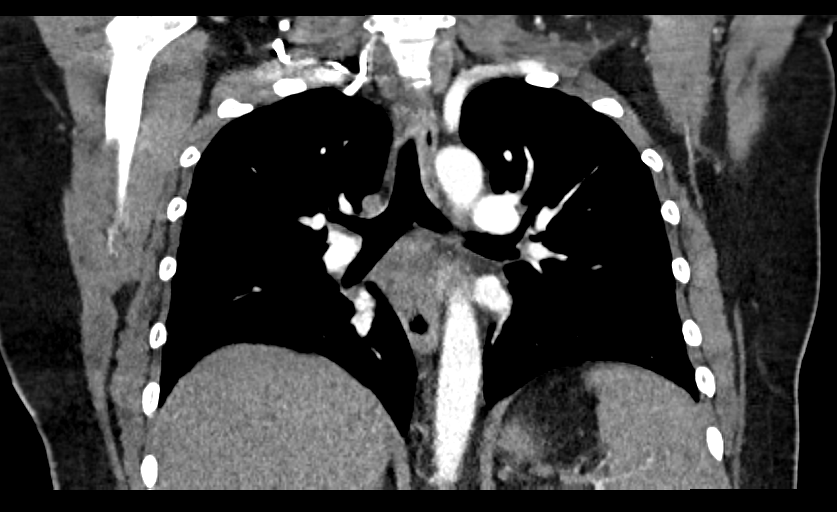

[Series 7: ax post chest-qa fov · axial · 0.70mm/px · z∈[+850,+1040]mm · 11 of 230 slices shown]
[im 20/230  lung]
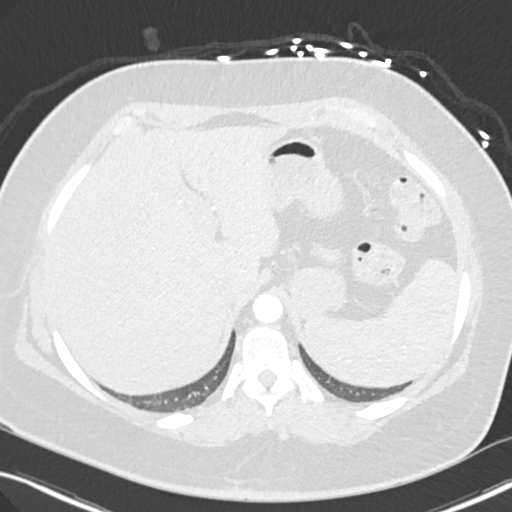
[im 39/230  mediastinal]
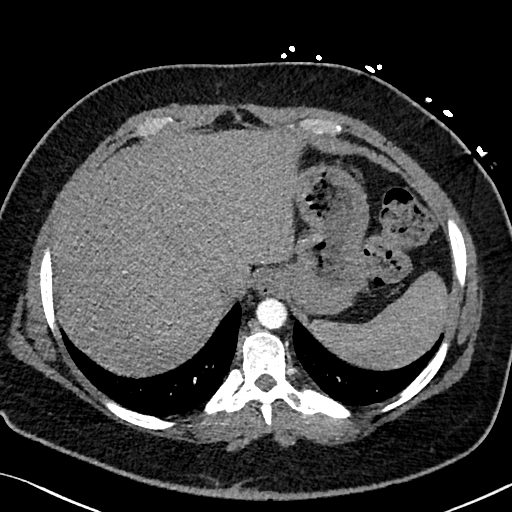
[im 58/230  lung]
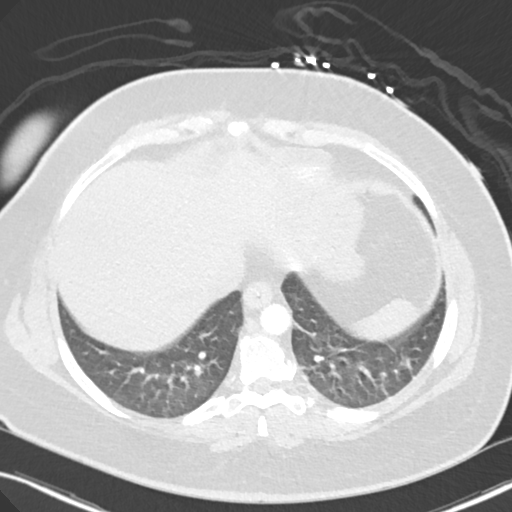
[im 77/230  mediastinal]
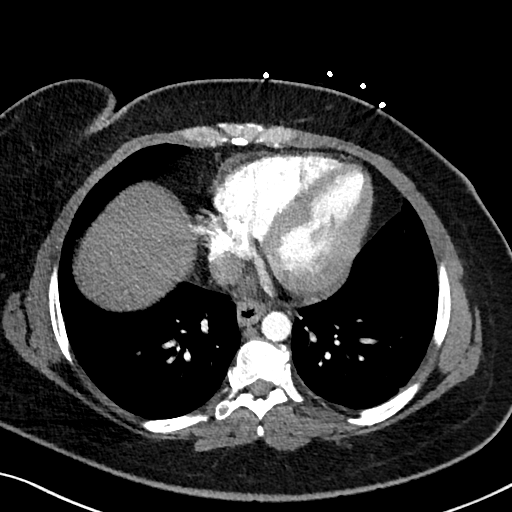
[im 96/230  lung]
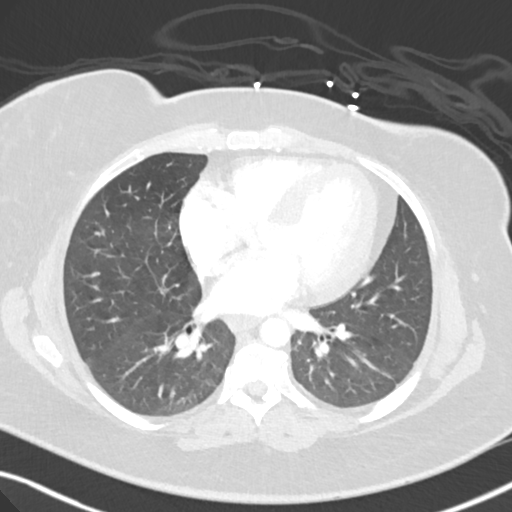
[im 115/230  mediastinal]
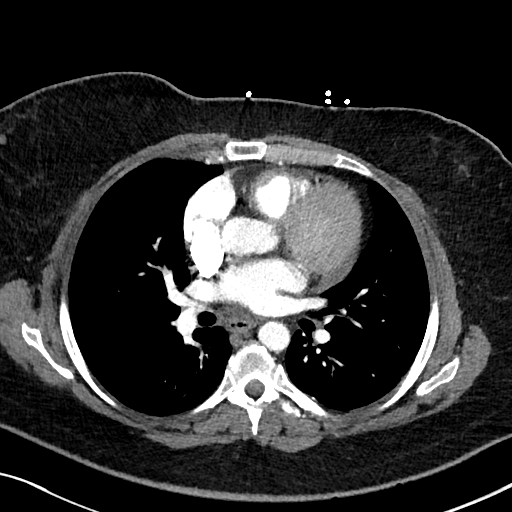
[im 134/230  lung]
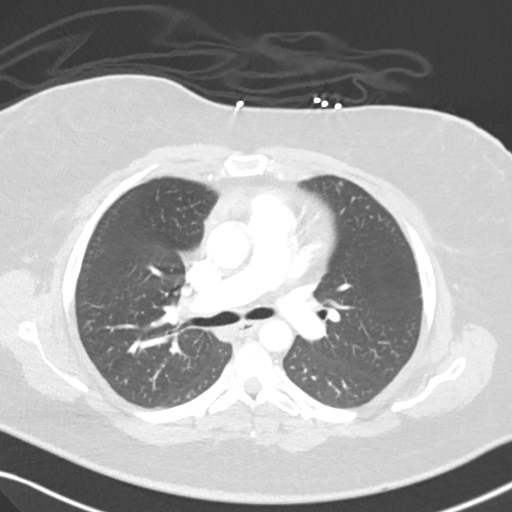
[im 153/230  mediastinal]
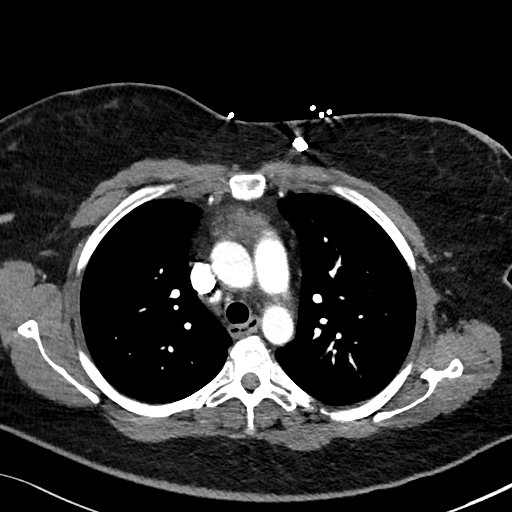
[im 172/230  lung]
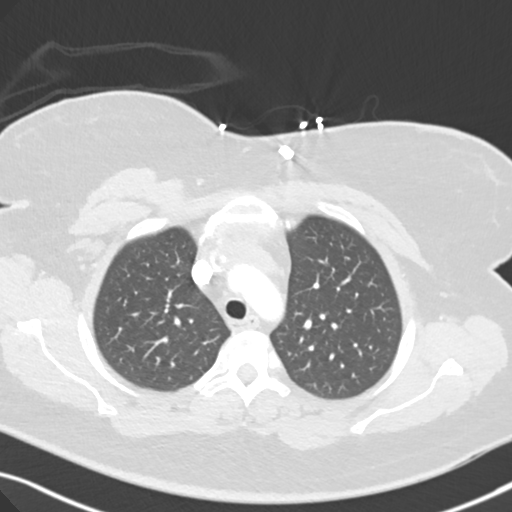
[im 191/230  mediastinal]
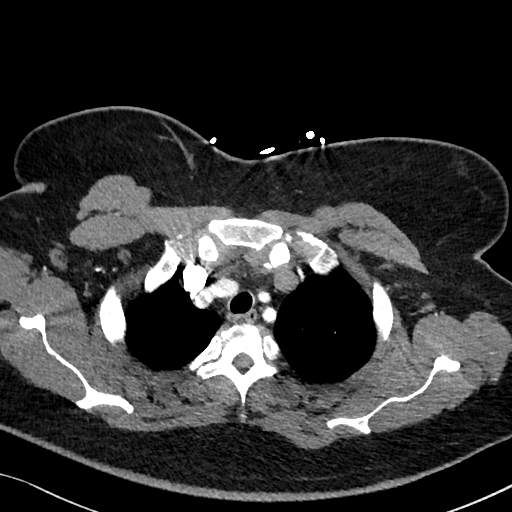
[im 210/230  lung]
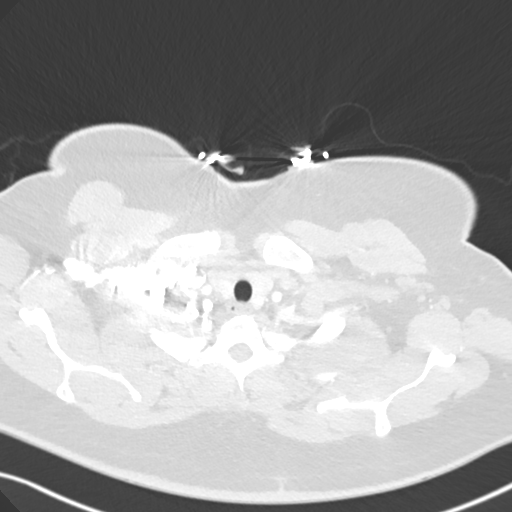

[14 of 36 positions shown; findings below may reference images not displayed]

FINDINGS: The imaged neck base, supraclavicular, and axillary structures are unremarkable.          
 The heart is normal in size, without pericardial effusion.                                
 No mediastinal or hilar lymphadenopathy.                                                  
 There is no filling defect within the main, lobar, segmental or visualized subsegmental   
 pulmonary arteries.                                                                       
 Major central airways are patent.                                                         
 Mild dependent bilateral basilar atelectasis. Negative for focal airspace consolidation,  
 pleural effusion, or pneumothorax.                                                        
 Similar, 5 mm right fissural nodule at the periphery of the right major fissure,          
 unchanged.                                                                                
 The imaged upper abdomen is unremarkable.                                                 
 The osseous structures and soft tissues show no acute abnormality.
IMPRESSION: Negative for acute intrathoracic finding or pulmonary embolism.

## 2021-05-18 IMAGING — CR DX Chest X-Ray Portable
1 series · 1 of 1 positions shown · non-contrast
Comparison: X-ray chest April 30, 2021

EXAM: DX Chest X-Ray Portable                                                             
 DATE: May 19, 2021
HISTORY: Chest Pain
TECHNIQUE: Single AP view of the chest.

[chest ap vg]
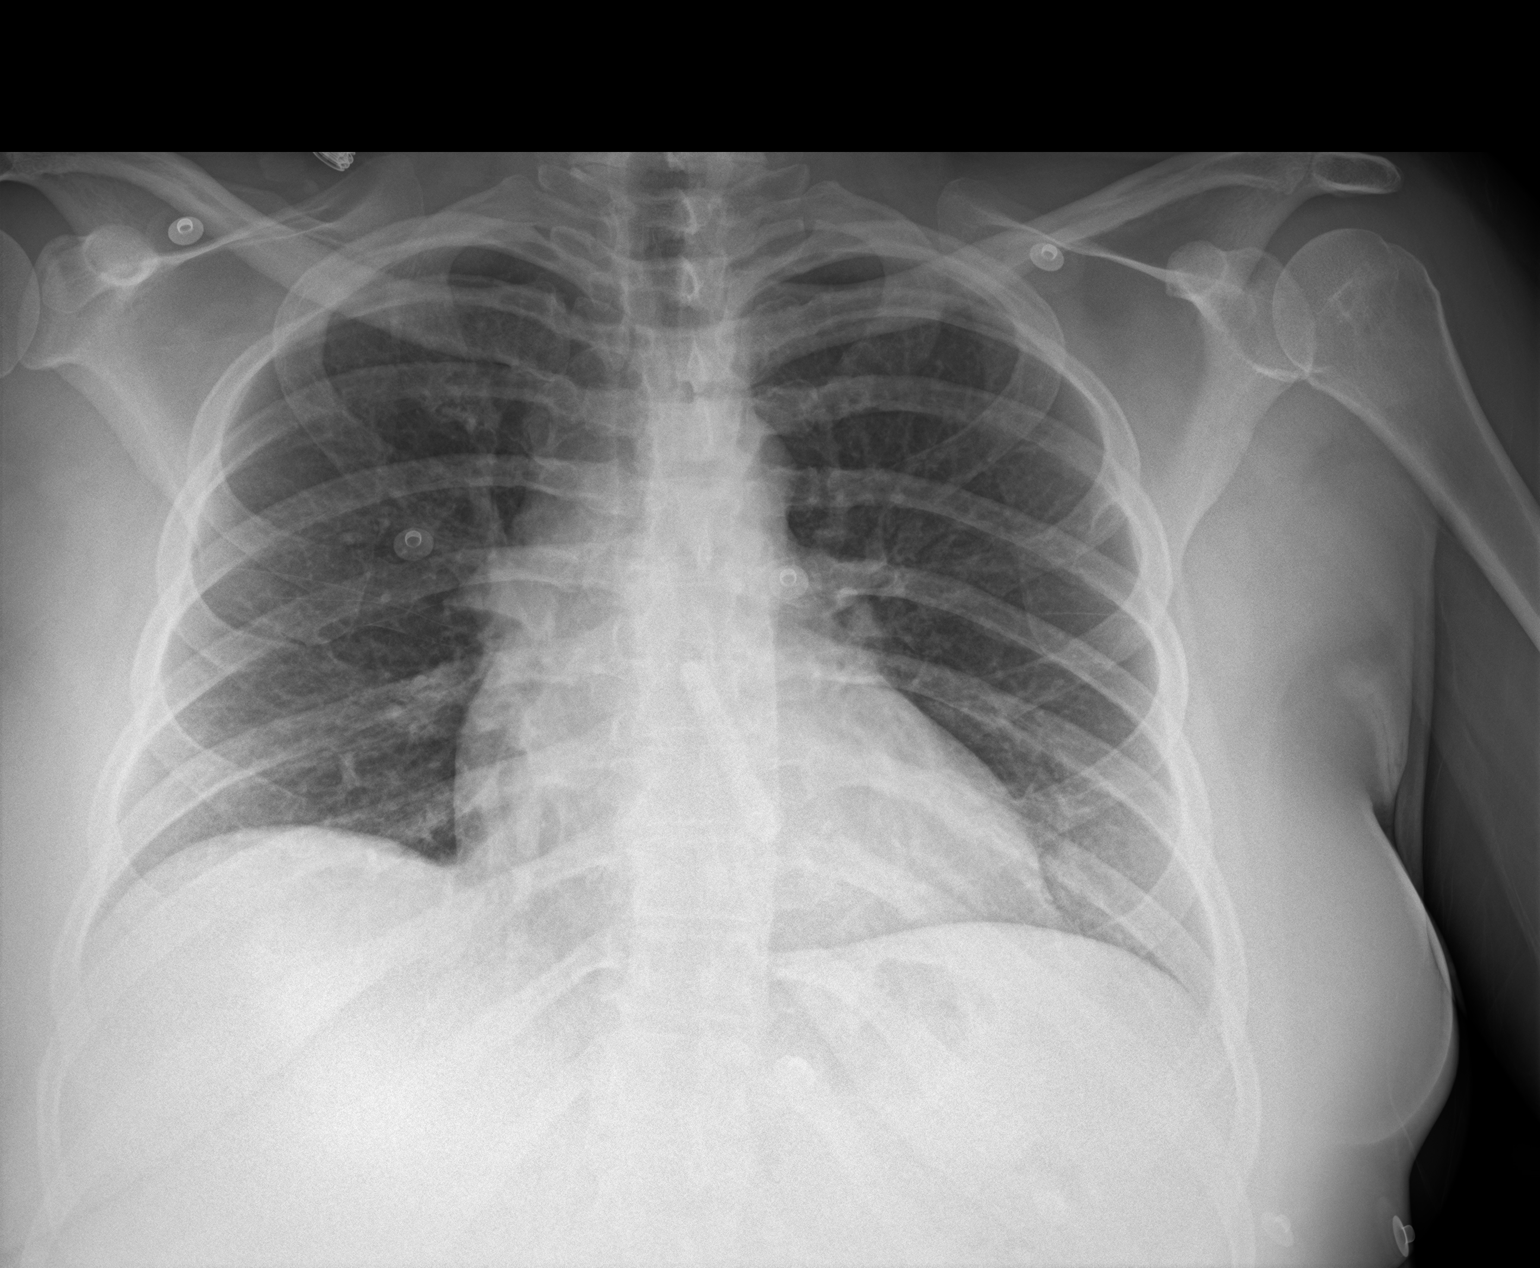

[1 of 1 positions shown; findings below may reference images not displayed]

FINDINGS: Heart size is normal. Mediastinal contours are stable. No focal airspace consolidation,   
 pleural effusion, or pneumothorax.
IMPRESSION: Negative for acute cardiopulmonary findings.

## 2021-05-26 IMAGING — CT CTA Neck
3 of 8 series · 7 of 34 positions shown · IV contrast (omnipaque)
Comparison: CT of the neck March 21, 2021

CTA Neck
INDICATION: Ischemic Stroke/Transient Ischemic Attack.                                   
 Pertinent History: CODE STROKE; Left side face, arm and leg weak and numb., [id number];     
 Diabetic, Hypertension, Multiple CVA's                                                    
 Surgical History: None                                                                    
 Cancer: None                                                                              
 GFR (past 30 days): >39 ml/min                                                            
 Intravenous contrast: 60 mL Omnipaque 350 total                                           
 Technologist Comments: None
TECHNIQUE: 1. Helical acquisition with multiplanar reconstruction.                                   
 2. Z-axis range: Aortic arch to just above sella turcica.                                 
 3. 3-D angiographic reconstruction (volume rendering and maximum intensity projection)    
 from manipulation of volume metric data utilizing an independent workstation.             
 Reference for carotid artery stenosis measurements:                                       
 -Direct angiographic stenosis calculation based on the distal internal carotid artery     
 luminal diameter as the denominator                                                       
 -NASCET criteria                                                                          
 Utilized dose reduction techniques include: Automated Exposure Control, vendor specific   
 iterative reconstruction technique

[Series 5: ax thins · axial · 0.39mm/px · z∈[+152,+263]mm · 2 of 552 slices shown]
[im 184/552  soft-tissue]
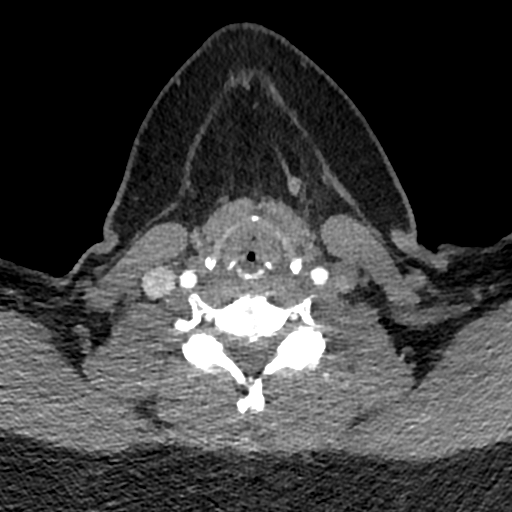
[im 368/552  soft-tissue]
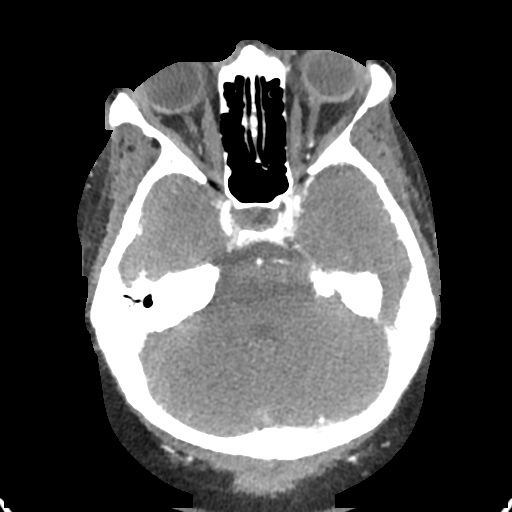

[Series 7: carotid and head to viz- one scan · axial · 0.39mm/px · z∈[+108,+306]mm · 4 of 662 slices shown]
[im 133/662  soft-tissue]
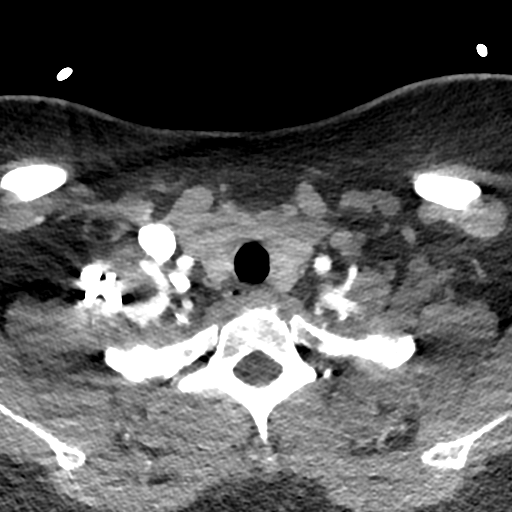
[im 265/662  bone]
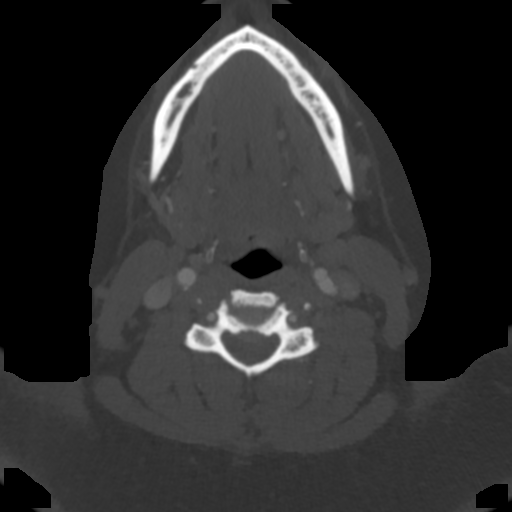
[im 397/662  soft-tissue]
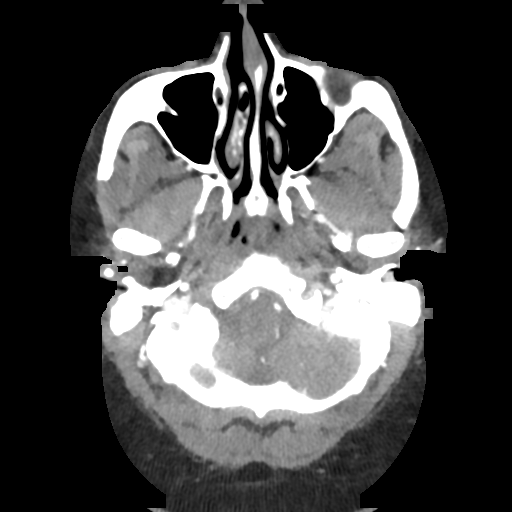
[im 529/662  bone]
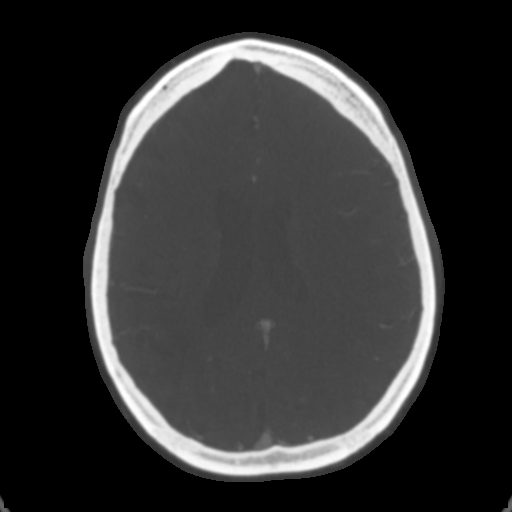

[Series 8: 2x2 sag · sagittal · 0.56mm/px · 1 of 199 slices shown]
[im 68/199  soft-tissue]
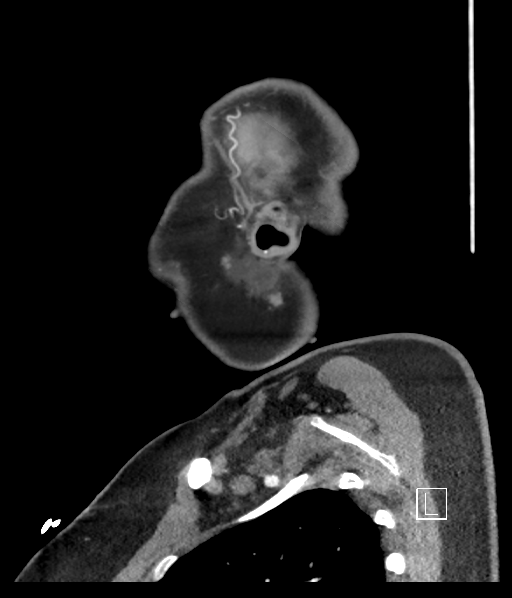

[7 of 34 positions shown; findings below may reference images not displayed]

FINDINGS: The included lung apices are without mass.                                                
 The proximal great vessels are patent. The right common carotid artery is patent. The     
 right internal carotid artery is patent and diffusely small in size, similar to the prior 
 CT angiogram.                                                                             
 The left common carotid artery is patent. The left internal carotid artery is patent, and 
 diffusely small in caliber, similar to the prior.                                         
 The vertebral arteries are patent. The right vertebral artery is dominant.                
 No aortic dissection noted.
IMPRESSION: No large vessel stenosis or occlusion identified in the neck.

## 2021-06-04 IMAGING — CR DX Chest X-Ray Portable
1 series · 1 of 1 positions shown · non-contrast
Comparison: AP film of chest from 05/18/2021.

EXAM:  Portable AP film of the chest performed 06/04/2021.
CLINICAL HISTORY: Possible stroke.

[chest ap vg]
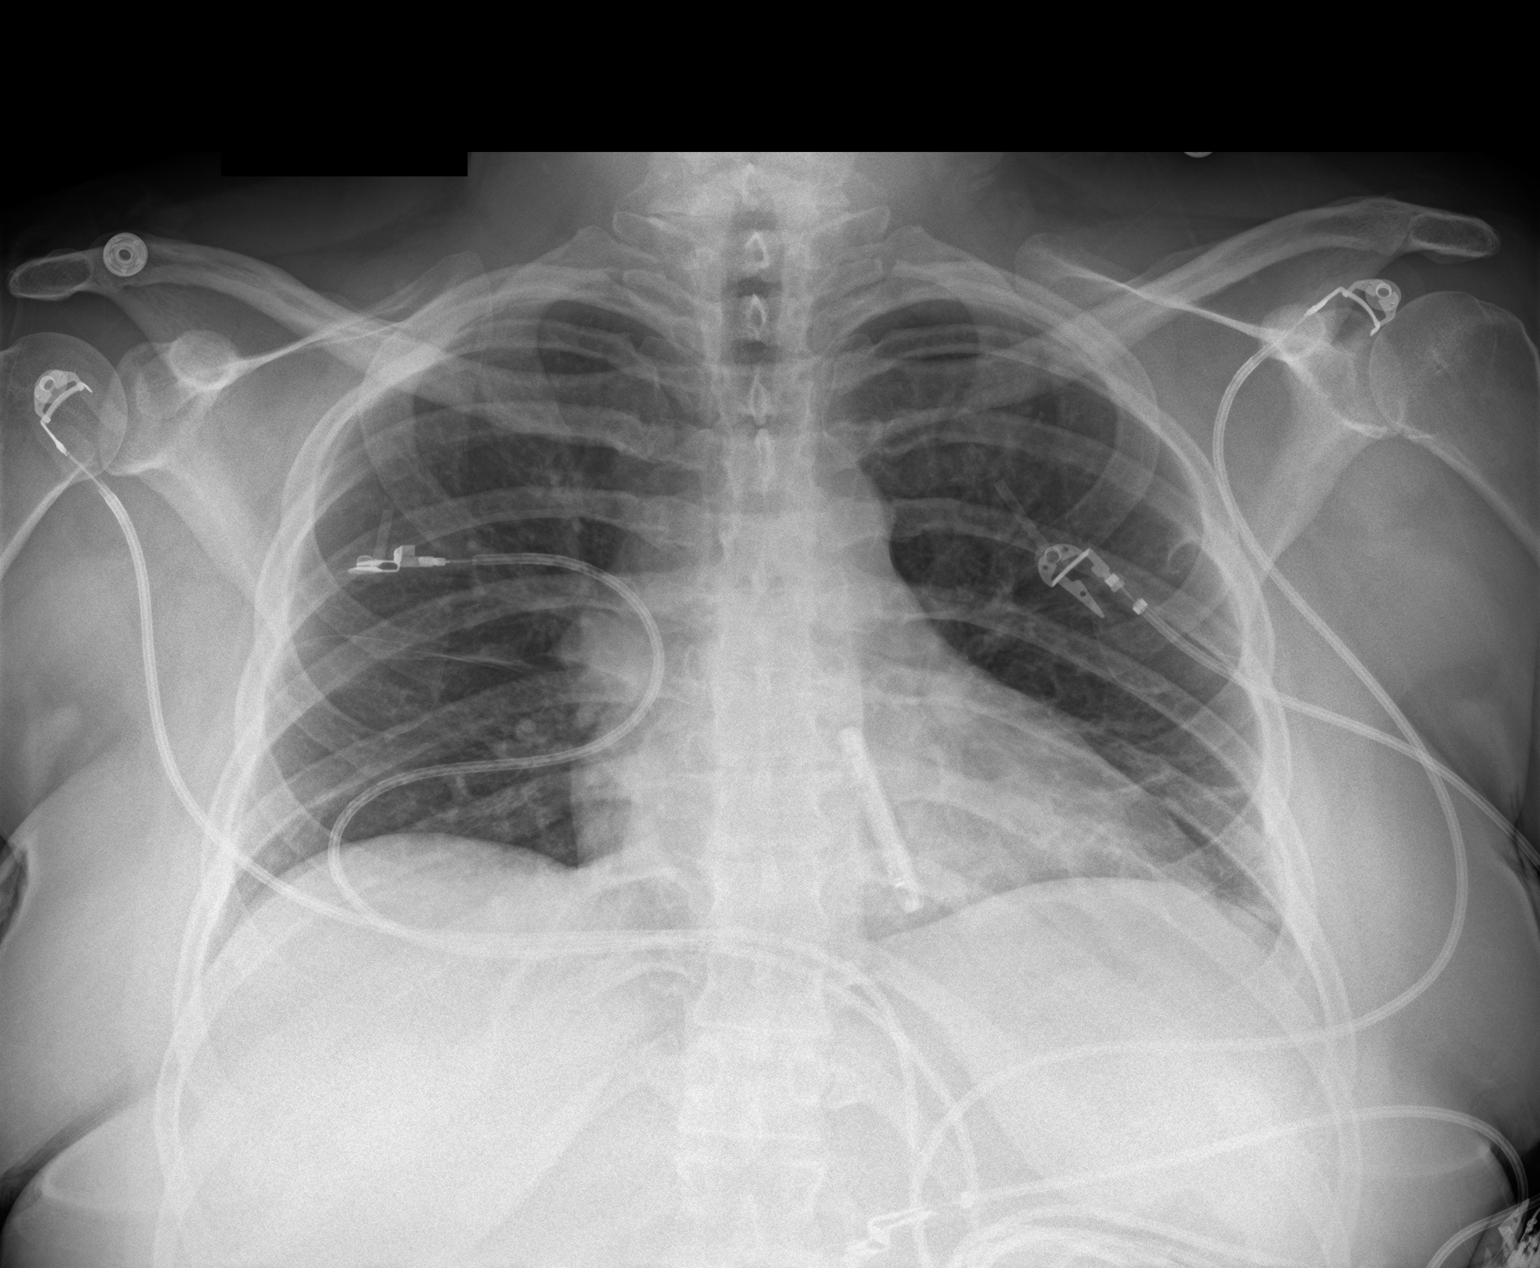

[1 of 1 positions shown; findings below may reference images not displayed]

FINDINGS: EKG leads project over the chest. Loop recorder projects over the chest. The    
 lungs are hypoexpanded with mild left basilar atelectasis. The pulmonary parenchyma is    
 otherwise clear.  There is no pleural fluid or pneumothorax evident.  The                 
 cardiomediastinal silhouette and pulmonary vascularity are within normal limits.  The     
 bones and soft tissues are unremarkable.
IMPRESSION: Hypoexpansion with mild left basilar atelectasis.

## 2021-06-04 IMAGING — CT CT Brain W-O Contrast AIS
3 of 4 series · 14 of 47 positions shown, 16 images · non-contrast
Comparison: None

CT Brain W-O Contrast AIS
INDICATION: Specific Reason Freetext Ischemic Stroke/Transient Ischemic  Attack. STROKE
PROTOCOL: PHONE RESULTS TO ORDERING PHYSICIAN.                                            
 Pertinent History: Patient presents to ER for possible stroke. Patient was watching TV    
 tonight and experienced full body numbness. Patient able to stand and walk. History of    
 HTN, DM.                                                                                  
 Surgical History:                                                                         
 Cancer: None                                                                              
 Technologist Comments: None
TECHNIQUE: Helical acquisition with sagittal and coronal reformats.                       
 Utilized dose reduction techniques include: Automated Exposure Control, vendor specific   
 iterative reconstruction technique

[Series 4: cor pre · coronal · non-contrast · 0.31mm/px · 3 of 67 slices shown]
[im 23/67  brain]
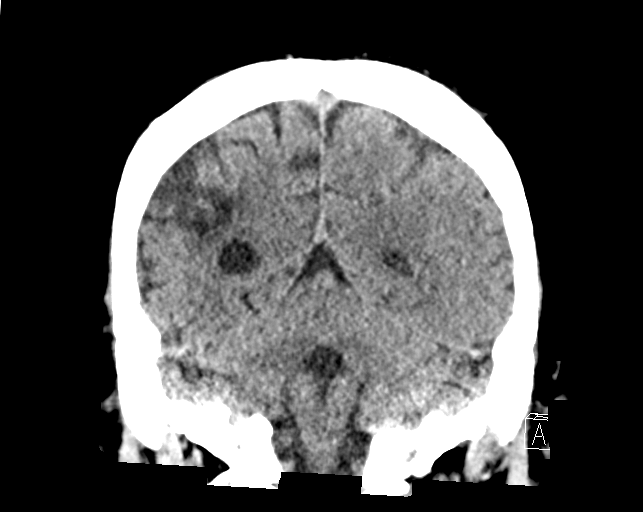
[im 30/67  brain]
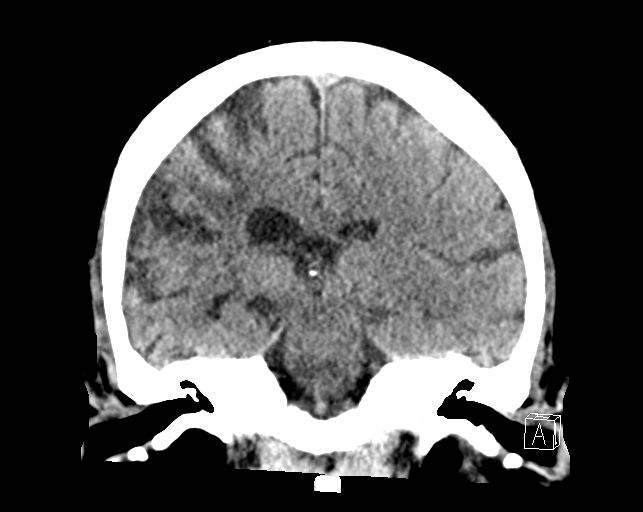
[im 37/67  brain]
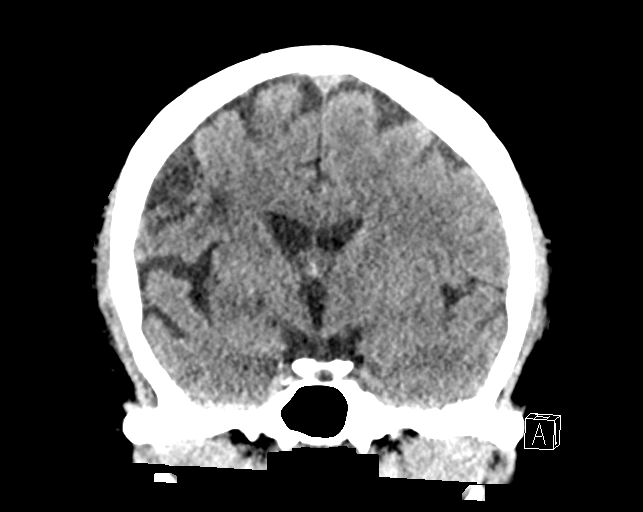

[Series 6: sag pre · sagittal · non-contrast · 0.29mm/px · 3 of 66 slices shown]
[im 22/66  brain]
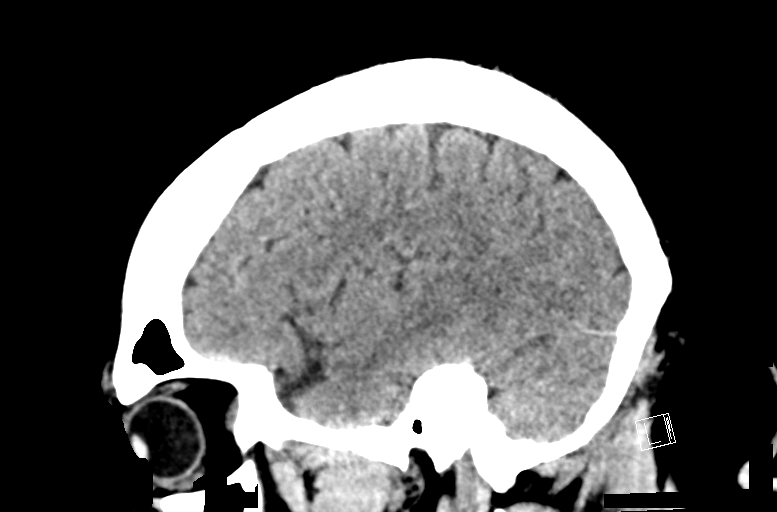
[im 33/66  brain]
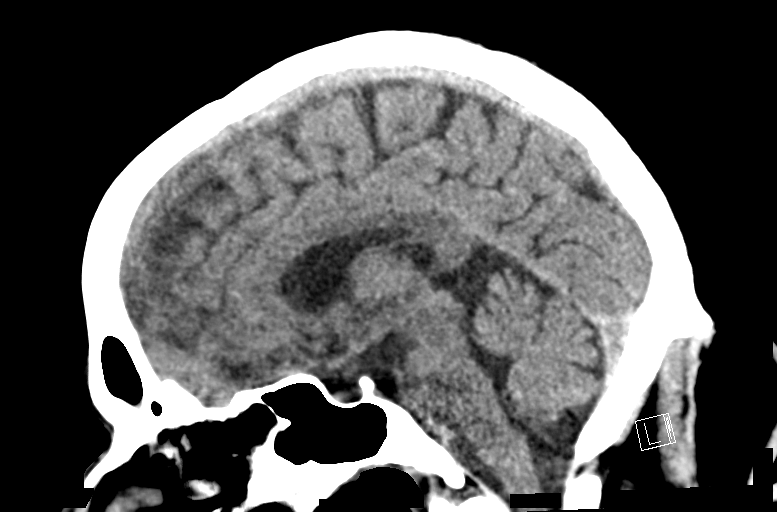
[im 44/66  brain]
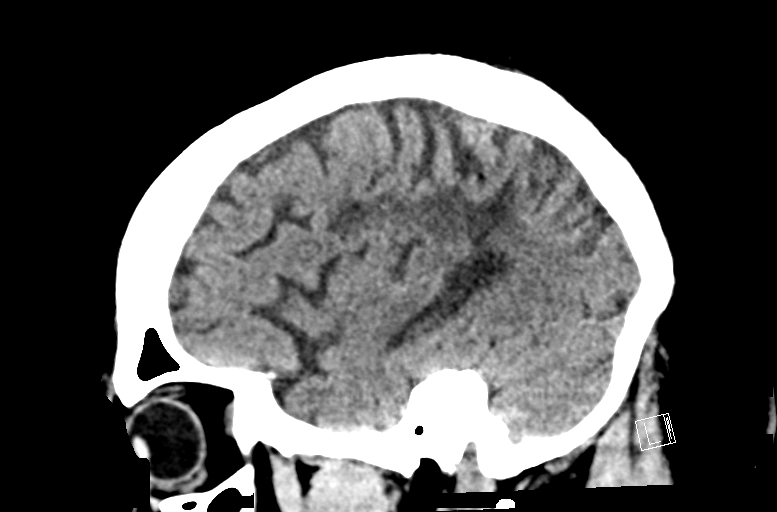

[Series 7: ax pre- autosend to viz · axial · non-contrast · 0.45mm/px · z∈[+1061,+1189]mm · 8 of 79 slices shown, 10 images]
[im 10/79  brain]
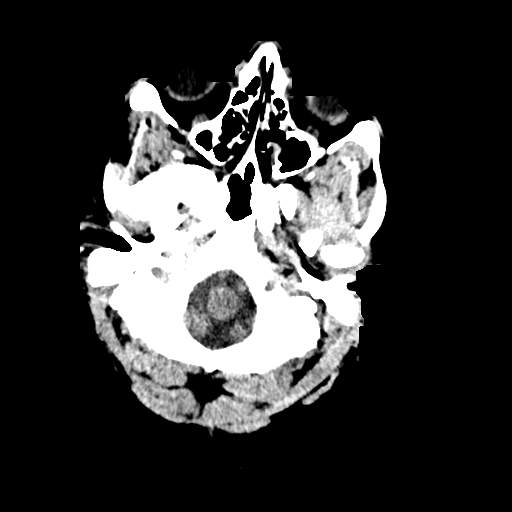
[im 10/79  bone]
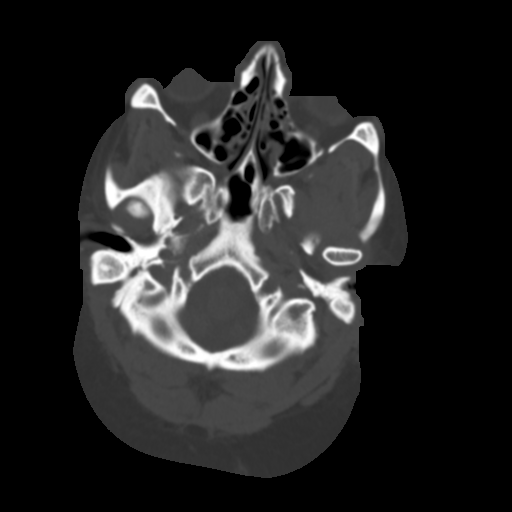
[im 19/79  brain]
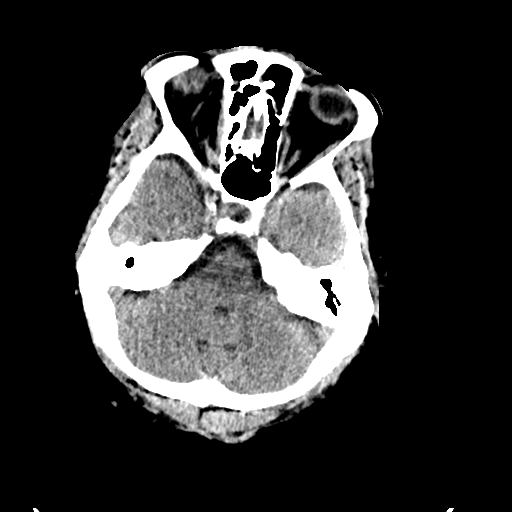
[im 28/79  brain]
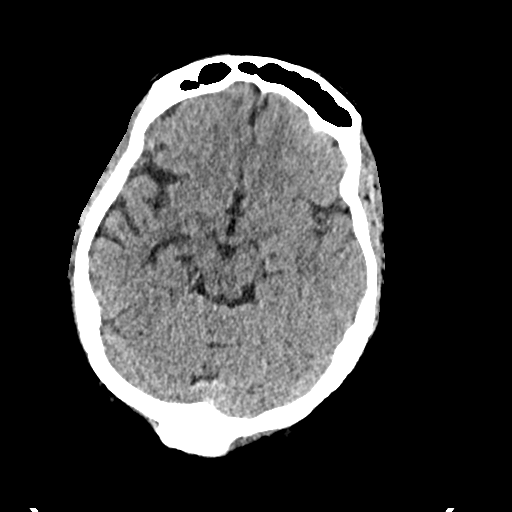
[im 37/79  brain]
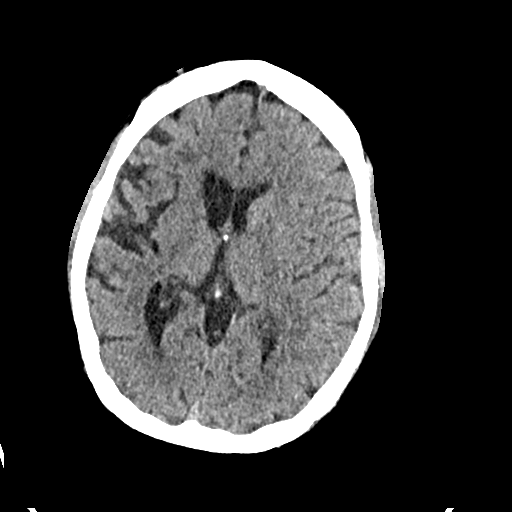
[im 46/79  brain]
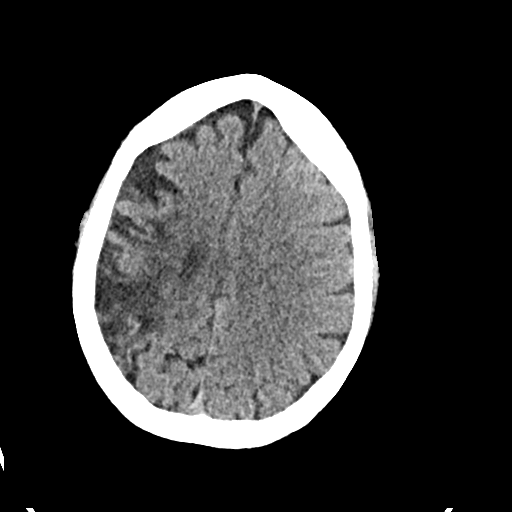
[im 46/79  bone]
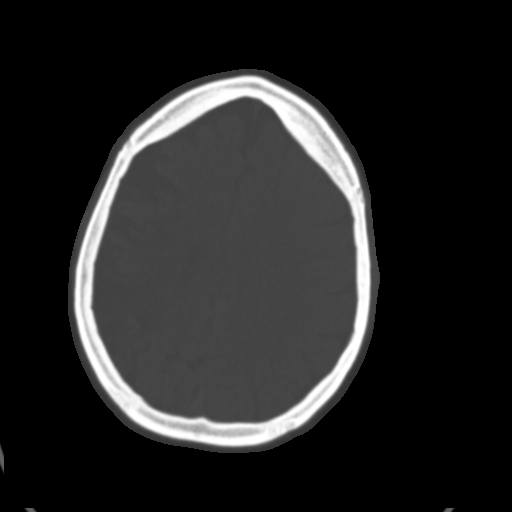
[im 56/79  brain]
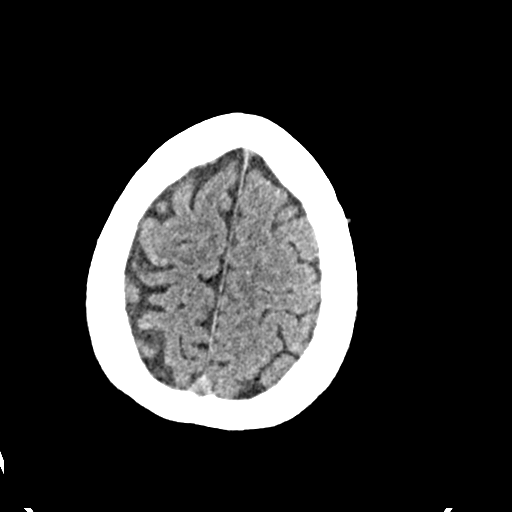
[im 65/79  brain]
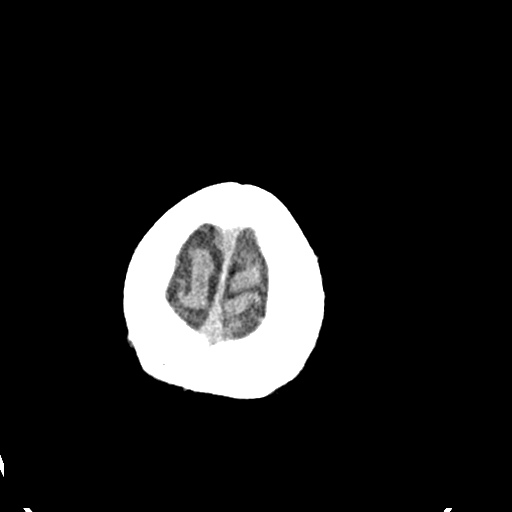
[im 74/79  brain]
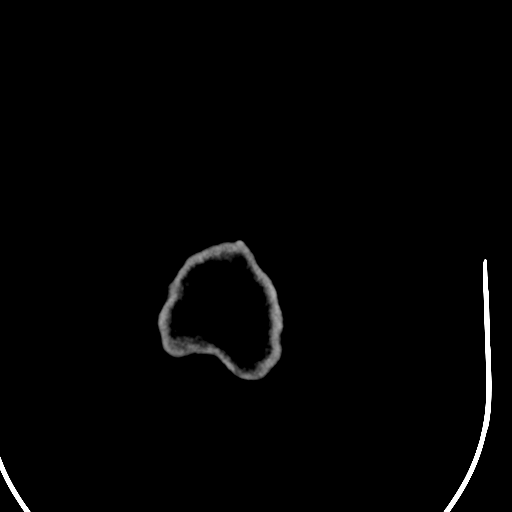

[14 of 47 positions shown; findings below may reference images not displayed]

FINDINGS: ASPECTS: Please note is difficult to accurately score patients with extensive white       
 matter disease. Regions of chronic ischemic infarction are also not included in the       
 score. Grading performed of bilateral MCA regions. Areas assessed include the caudate,    
 internal capsule, lentiform nucleus, insular ribbon, M1, M2, M3, M4, M5, and M6. A single 
 point is subtracted from 10 for each area of involvement.                                 
 Abnormal Regions: None.                                                                   
 Total ASPECT score:  10, normal.                                                          
 HEMORRHAGE: No acute intracranial hemorrhage.                                             
 BRAIN PARENCHYMA, ADDITIONAL:                                                             
 Right MCA distribution infarct involving the right frontal and parietal lobes and to a    
 smaller extent the right insula. Additional multiple remote appearing lacunar infarcts of 
 the right basal ganglia. Small remote infarct of the left parietal lobe in the            
 parafalcine region.                                                                       
 BASAL CISTERNS AND EXTRA AXIAL SPACES:  Normal.                                           
 VENTRICULAR SYSTEM: Normal for patient's age.                                             
 MIDLINE SHIFT: None.                                                                      
 CALVARIUM: Normal.                                                                        
 VISUALIZED PARANASAL SINUSES: Scattered mild inflammatory mucosal thickening.             
 VISUALIZED ORBITS: Normal.                                                                
 OTHER: None.
IMPRESSION: 1.  No acute intracranial abnormality.                                                    
 2.  ASPECT score: 10, normal.                                                             
 3.  Remote right MCA distribution infarcts with additional remote lacunar infarcts of the 
 right basal ganglia and left parietal lobe.

## 2021-06-06 DIAGNOSIS — O219 Vomiting of pregnancy, unspecified: Secondary | ICD-10-CM

## 2021-06-06 DIAGNOSIS — R110 Nausea: Secondary | ICD-10-CM

## 2021-06-06 IMAGING — MR MR Brain W-O Contrast
6 of 10 series · 30 of 48 positions shown · non-contrast
Comparison: MR brain without contrast 05/27/2021

EXAMINATION:  MR brain without contrast
HISTORY: Specific Reason Freetext cva. Weakness.
TECHNIQUE: Sagittal T1, axial T2 and FLAIR, axial gradient echo, axial diffusion-weighted 
 imaging with ADC map sequences without contrast

[Series 5: DWI · axial · 5.0mm · 1.20mm/px · z∈[-66,+78]mm · 9 of 50 slices shown (1 of 2)]
[im 1/50]
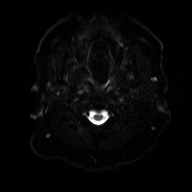
[im 7/50]
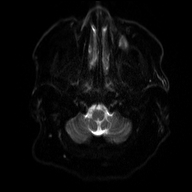
[im 13/50]
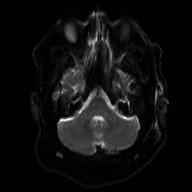
[im 19/50]
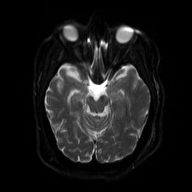
[im 25/50]
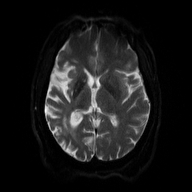
[im 31/50]
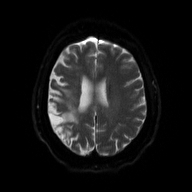
[im 37/50]
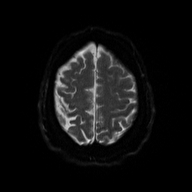
[im 43/50]
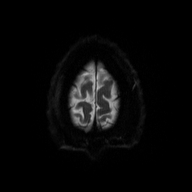
[im 50/50]
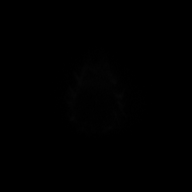

[Series 6: DWI · axial · 5.0mm · 1.20mm/px · z∈[-66,+78]mm · 4 of 25 slices shown (2 of 2)]
[im 1/25]
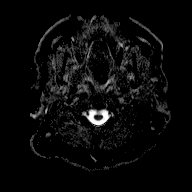
[im 9/25]
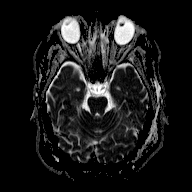
[im 17/25]
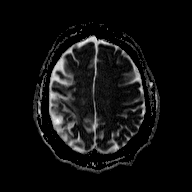
[im 25/25]
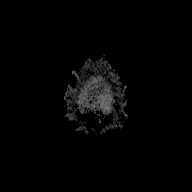

[Series 7: T1 · sagittal · 5.0mm · 0.47mm/px · 4 of 22 slices shown]
[im 1/22]
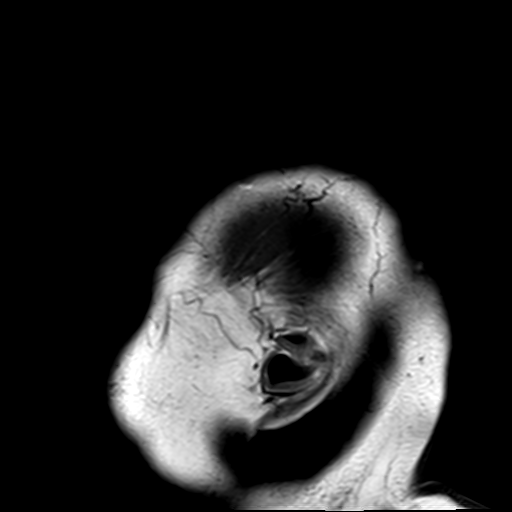
[im 8/22]
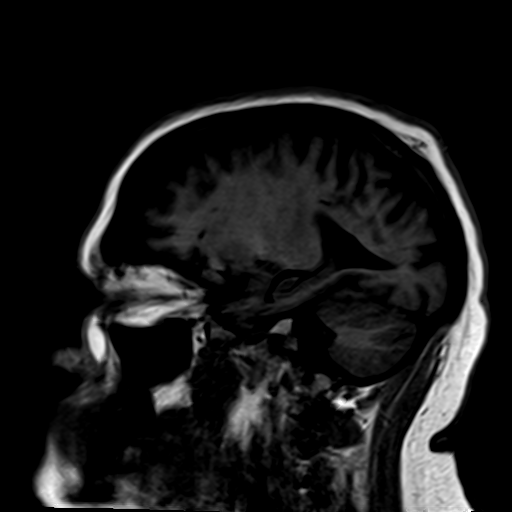
[im 15/22]
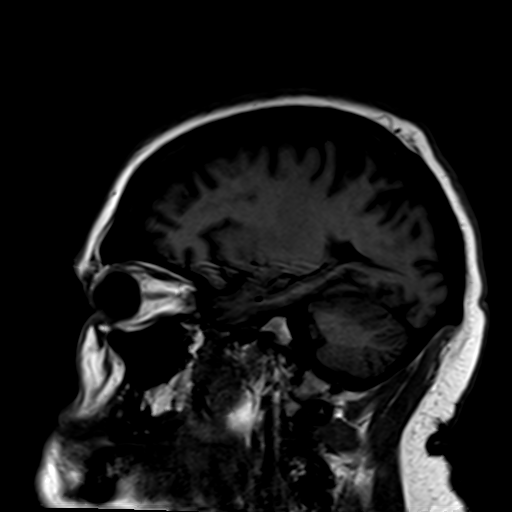
[im 22/22]
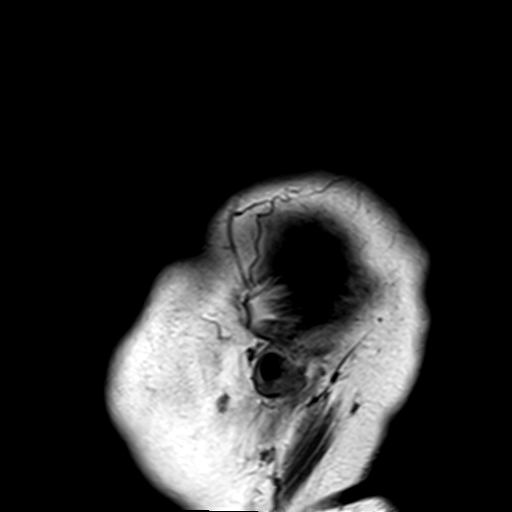

[Series 8: FLAIR · axial · 5.0mm · 0.45mm/px · z∈[-66,+78]mm · 4 of 25 slices shown (1 of 2)]
[im 1/25]
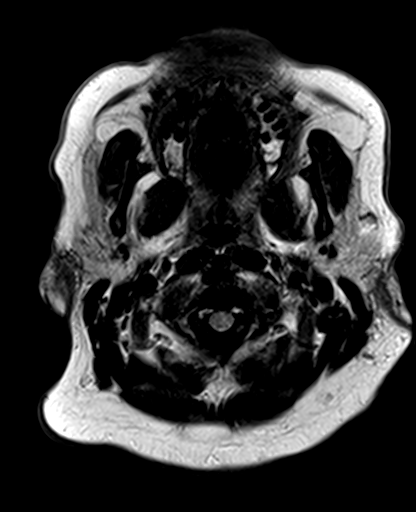
[im 9/25]
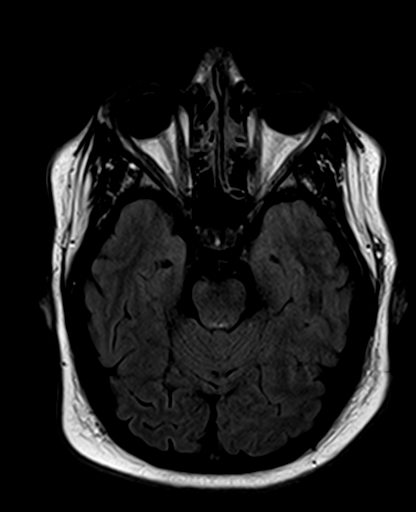
[im 17/25]
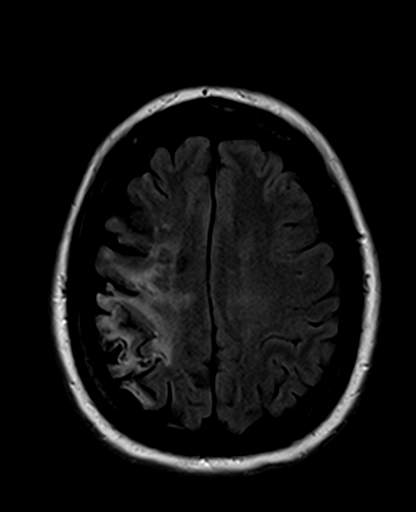
[im 25/25]
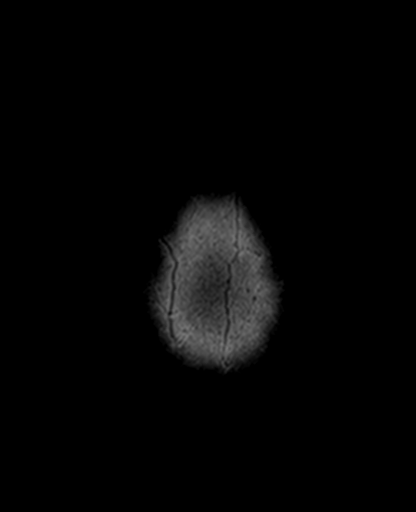

[Series 9: T2 · axial · 5.0mm · 0.36mm/px · z∈[-66,+78]mm · 4 of 25 slices shown]
[im 1/25]
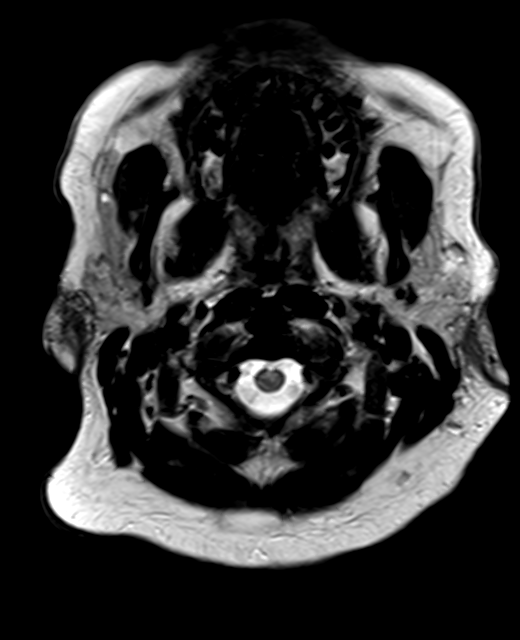
[im 9/25]
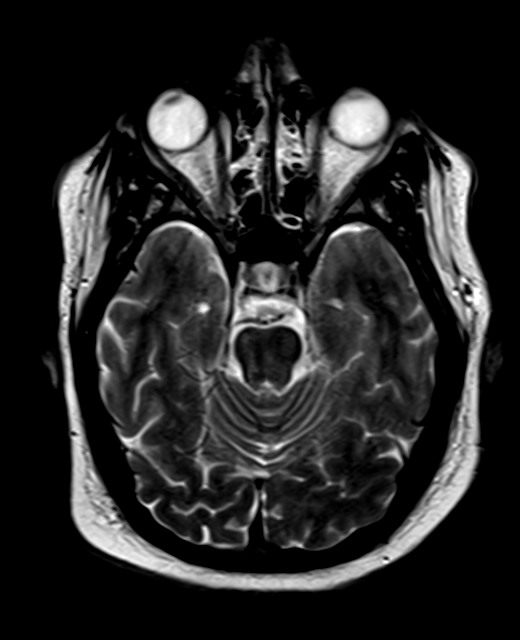
[im 17/25]
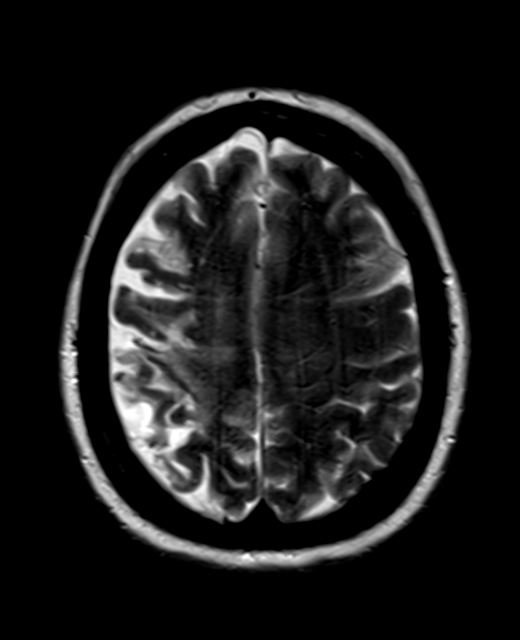
[im 25/25]
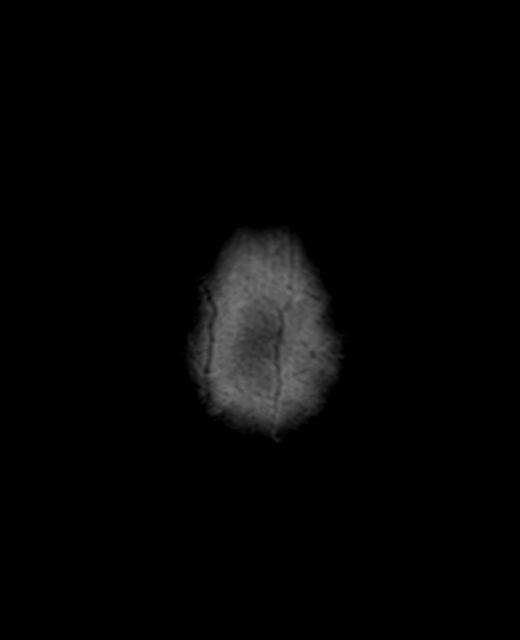

[Series 11: FLAIR · coronal · 5.0mm · 0.45mm/px · 5 of 28 slices shown (2 of 2)]
[im 1/28]
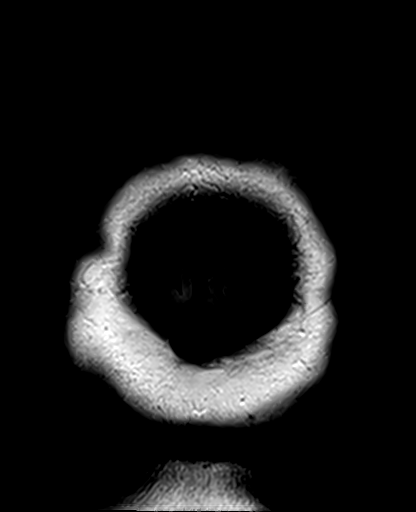
[im 7/28]
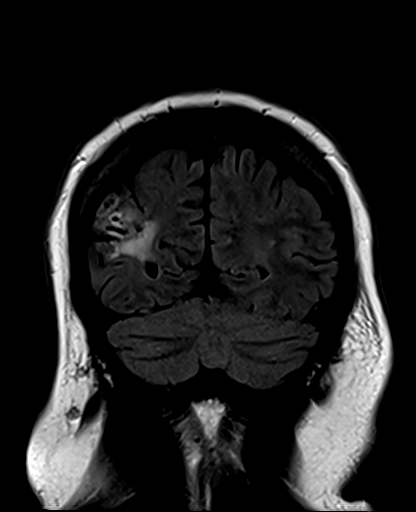
[im 14/28]
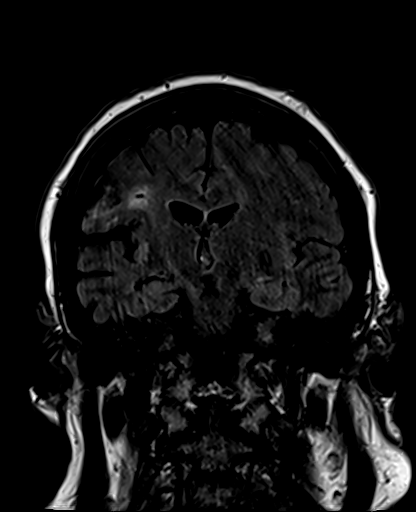
[im 21/28]
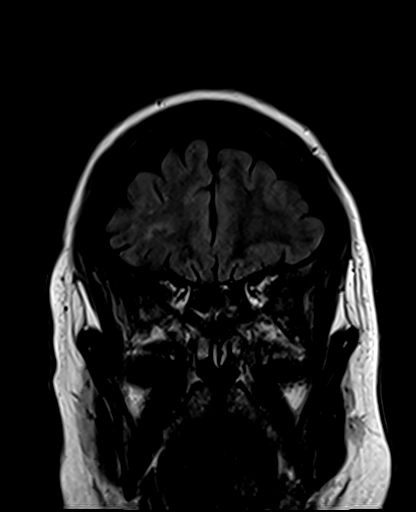
[im 28/28]
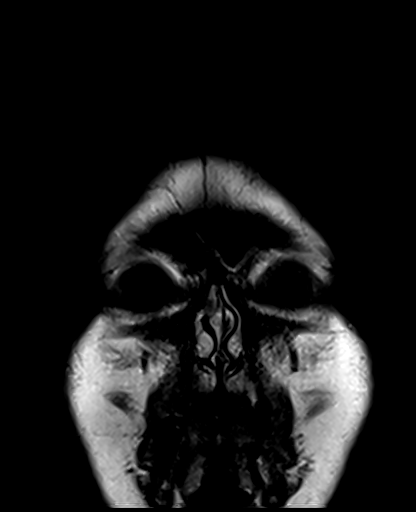

[30 of 48 positions shown; findings below may reference images not displayed]

FINDINGS: Image degradation from motion artifact                                                    
 Chronic encephalomalacia from old infarct in the posterior right frontal and anterior     
 right parietal lobe region similar to previous study. Adjacent gliosis again identified.  
 Ex vacuo dilatation of the right lateral ventricle similar to previous study. No midline  
 shift or overt hydrocephalus.                                                             
 No pathologic extra-axial fluid collection or acute intracranial hemorrhage.              
 Additional T2/FLAIR hyperintense white matter lesions periventricular and subcortical     
 white matter cerebral hemispheres similar to previous study suggesting superimposed       
 chronic small vessel ischemic changes.                                                    
 No focus of restricted diffusion identified to suggest acute infarct. Evaluation for      
 enhancing parenchymal lesion limited due to lack of intravenous contrast.                 
 Reportedly known chronic occlusion of the right MCA by prior report. Remaining expected   
 major intracranial vascular flow voids otherwise grossly present. Midline craniocervical  
 structures grossly intact.                                                                
 Mucosal thickening in the frontal sinuses left great than right with opacification and    
 mucosal thickening multiple bilateral ethmoid air cells overall progressed since previous 
 study. Mild mucosal thickening bilateral maxillary sinuses.                               
 Bilateral mastoid effusions right greater than left slightly increases previous           
 study.
IMPRESSION: 1. Senescent changes of brain with chronic encephalomalacia from old infarct at the right 
 frontoparietal junction and chronic small vessel ischemic changes in the white matter as  
 seen on previous study.                                                                   
 2. No MR findings to suggest acute infarct, acute intracranial hemorrhage, midline shift  
 or overt hydrocephalus.                                                                   
 3. Progressed paranasal sinus disease with slight increased bilateral mastoid effusions   
 right greater than left since previous study..

## 2021-06-06 NOTE — ED Notes (Signed)
Pt arrived via Triage ambulatory c/o fever and chills that started 3 days ago. Pt denies any pain anywhere. Pt is [redacted] weeks pregnant and has her first OB appointment tomorrow.

## 2021-06-07 ENCOUNTER — Inpatient Hospital Stay
Admit: 2021-06-07 | Discharge: 2021-06-07 | Disposition: A | Discharge status: Home / Self Care | Payer: PRIVATE HEALTH INSURANCE | Attending: Emergency Medicine

## 2021-06-07 LAB — POC URINE MACROSCOPIC
Bilirubin, Urine: NEGATIVE
Bilirubin: NEGATIVE
Blood, Urine: NEGATIVE
Blood: NEGATIVE
Glucose, Ur: NEGATIVE mg/dl
Glucose: NEGATIVE mg/dl
Ketone: NEGATIVE mg/dl
Ketones, Urine: NEGATIVE mg/dl
Leukocyte Esterase, Urine: NEGATIVE
Leukocyte Esterase: NEGATIVE
Nitrite, Urine: NEGATIVE
Nitrites: NEGATIVE
Protein, UA: NEGATIVE mg/dl
Protein: NEGATIVE mg/dl
Specific Gravity, UA: 1.02 (ref 1.005–1.030)
Specific gravity: 1.02 (ref 1.005–1.030)
Urobilinogen, UA, POCT: 0.2 EU/dl (ref 0.0–1.0)
Urobilinogen: 0.2 EU/dl (ref 0.0–1.0)
pH (UA): 6 (ref 5–9)
pH, UA: 6 (ref 5–9)

## 2021-06-07 LAB — POC HCG,URINE
HCG urine, QL: POSITIVE — AB
Pregnancy Test(Urn): POSITIVE — AB

## 2021-06-07 LAB — SARS-COV-2_FLU_RSV
COVID-19: NEGATIVE
Influenza A PCR: NEGATIVE
Influenza B PCR: NEGATIVE
RSV by PCR: NEGATIVE

## 2021-06-07 NOTE — ED Provider Notes (Addendum)
ED  Provider Notes by Orville GovernJames, Briggs Edelen, [place] at 06/07/21 0011                Author: Orville GovernJames, Ian Cavey, [place]  Service: Emergency Medicine  Author Type: Physician Assistant       Filed: 06/07/21 0555  Date of Service: 06/07/21 0011  Status: Attested           Editor: Orville GovernJames, Maame Dack, PA (Physician Assistant)  Cosigner: Loa SocksSantiago-Rodriguez, Tirza, MD at 06/08/21 [phone]          Attestation signed by Loa SocksSantiago-Rodriguez, Tirza, MD at 06/08/21 [phone]          I have discussed this case with the advanced practice provider.  We have reviewed the presentation, pertinent historical and physical exam findings, as well  as relevant laboratory/radiology findings.  I have been available at all times and agree with the management and disposition documented here.                                [facility]   Emergency Department Treatment Report                Patient: Autumn Velez  Age: 36 y.o.  Sex: female          Date of Birth: 12/22/1985  Admit Date: 06/06/2021  PCP: Tomma LightningHartline, Steven M, MD         MRN: [id number]   CSN: [id number]   Attending: Loa SocksSANTIAGO-RODRIGUEZ, TIRZA, MD         Room: [id number]  Time Dictated: 12:11 AM  APP: Orville GovernJames, Keiton Cosma           Chief Complaint      Chief Complaint       Patient presents with        ?  Fever             History of Present Illness        Patient is a 36 y.o. female whom is 7-1/[redacted] weeks pregnant and comes to the ED complaining of chills that she has had for 1 week and then noted she had a fever this afternoon.  She took her temperature and it was 99.7 but it decreased an hour later down  to 98.5 with no treatment with Tylenol or other medications.  Patient is concerned because this is her very first pregnancy, she is G1, P0 and has an appointment with Dr. Latanya MaudlinGrimes in 5 days for her first OB visit.  She does endorse having nausea and a decreased  appetite due to the pregnancy but denies any other issues with the pregnancy.  Patient states this is her first pregnancy and she is unsure what type of  medication she would be allowed to take while pregnant and does not want to harm the baby.        Review of Systems        Review of Systems    Constitutional:  Positive for chills and fever . Negative for malaise/fatigue.    HENT: Negative.      Eyes: Negative.  Negative for discharge and redness.    Respiratory: Negative.  Negative for shortness of breath.     Cardiovascular: Negative.  Negative for chest pain.    Gastrointestinal:  Positive for nausea. Negative for abdominal pain, diarrhea and vomiting.    Genitourinary: Negative.     Musculoskeletal: Negative.  Negative for back pain.    Skin:  Negative.  Negative for rash.    Neurological: Negative.  Negative for headaches.    Endo/Heme/Allergies: Negative.     Psychiatric/Behavioral: Negative.      All other systems reviewed and are negative.         Past Medical/Surgical History          Past Medical History:        Diagnosis  Date         ?  Fatigue       ?  Iron deficiency           ?  Vitamin D deficiency          History reviewed. No pertinent surgical history.        Social History          Social History          Socioeconomic History         ?  Marital status:  SINGLE              Spouse name:  Not on file         ?  Number of children:  Not on file     ?  Years of education:  Not on file     ?  Highest education level:  Not on file       Occupational History        ?  Not on file       Tobacco Use         ?  Smoking status:  Never     ?  Smokeless tobacco:  Never       Substance and Sexual Activity         ?  Alcohol use:  Not Currently     ?  Drug use:  Never     ?  Sexual activity:  Not on file        Other Topics  Concern        ?  Not on file       Social History Narrative        ?  Not on file          Social Determinants of Health          Financial Resource Strain: Not on file     Food Insecurity: Not on file     Transportation Needs: Not on file     Physical Activity: Not on file     Stress: Not on file     Social Connections: Not on file      Intimate Partner Violence: Not on file       Housing Stability: Not on file             Family History          Family History         Problem  Relation  Age of Onset          ?  Colon Cancer  Mother       ?  Hypertension  Father       ?  Other  Father                Sarcoid          ?  Colon Cancer  Maternal Grandmother               Current Medications          Prior  to Admission Medications     Prescriptions  Last Dose  Informant  Patient Reported?  Taking?      ibuprofen (MOTRIN) 600 mg tablet      No  No      Sig: Take 1 Tab by mouth every six (6) hours as needed for Pain.      norethindrone-ethinyl estradiol-iron (MICROGESTIN FE1.5/30) 1.5 mg-30 mcg (21)/75 mg (7) tab      Yes  No      Sig: Take 1 Tablet by mouth daily.               Facility-Administered Medications: None             Current Outpatient Medications          Medication  Sig  Dispense  Refill           ?  norethindrone-ethinyl estradiol-iron (MICROGESTIN FE1.5/30) 1.5 mg-30 mcg (21)/75 mg (7) tab  Take 1 Tablet by mouth daily.               ?  ibuprofen (MOTRIN) 600 mg tablet  Take 1 Tab by mouth every six (6) hours as needed for Pain.  20 Tab  0             Allergies          Allergies        Allergen  Reactions         ?  Peanut  Anaphylaxis             Physical Exam          ED Triage Vitals [06/06/21 2222]     ED Encounter Vitals Group           BP  125/80        Pulse (Heart Rate)  94        Resp Rate  18        Temp  98.7 ??F (37.1 ??C)        Temp src          O2 Sat (%)  100 %        Weight  185 lb           Height  5\' 8"         Patient Vitals for the past 24 hrs:            Temp  Pulse  Resp  BP  SpO2            06/06/21 2222  98.7 ??F (37.1 ??C)  94  18  125/80  100 %           Physical Exam   Vitals and nursing note reviewed.    Constitutional:        General: She is not in acute distress.      Appearance: Normal appearance. She is well-developed and normal weight. She is not ill-appearing.    HENT:       Head: Normocephalic and  atraumatic.       Nose: Nose normal.       Mouth/Throat:       Mouth: Mucous membranes are moist.    Cardiovascular:       Rate and Rhythm: Normal rate and regular rhythm.       Heart sounds: Normal heart sounds. No murmur heard.     No friction rub. No gallop.    Pulmonary:       Effort: Pulmonary  effort is normal.       Breath sounds: Normal breath sounds. No wheezing, rhonchi or rales.    Chest:       Chest wall: No mass or deformity.    Abdominal:       Palpations: Abdomen is soft.       Tenderness: There is no abdominal tenderness. There is no guarding or rebound.     Musculoskeletal:          General: Normal range of motion.       Cervical back: Normal range of motion and neck supple.    Skin:      General: Skin is warm and dry.    Neurological:       General: No focal deficit present.       Mental Status: She is alert and oriented to person, place, and time.       Gait: Gait normal.    Psychiatric:          Mood and Affect: Mood normal.          Behavior: Behavior normal.              Impression and Management Plan        Patient is 36 year old female who is 7.[redacted] weeks pregnant, G1,P0, with no pregnancy concerns but she has had chills x1 week and was concerned because she thought she had a fever of 99.7 and since this is her first time pregnant she was concerned about it  affecting the baby.  Patient did not take Tylenol and her temperature decreased to 98.5.  Discussed with patient that pregnant women could have increased temperatures in the 99's and that it is unlikely she had a fever especially since her temperature  decreased without treatment and she has no other symptoms of viral condition including cough/congestion, shortness of breath, abdominal pain, diarrhea and only has some mild nausea and decreased appetite with her pregnancy.  Preemptive COVID/flu/RSV ordered  prior to evaluation and is pending.  Urinalysis and urine pregnancy also ordered.  Discussed with patient that she could take Tylenol to  treat a true fever or pain.      Differential diagnoses: Viral syndrome, infection           Diagnostic Studies        Labs:      Recent Results (from the past 12 hour(s))     SARS-COV-2_FLU_RSV          Collection Time: 06/06/21 11:33 PM       Specimen: NASOPHARYNGEAL SWAB         Result  Value  Ref Range            COVID-19  NEGATIVE  NEGATIVE         Influenza A PCR  NEGATIVE  NEGATIVE         Influenza B PCR  NEGATIVE  NEGATIVE         RSV by PCR  NEGATIVE  NEGATIVE         POC URINE MACROSCOPIC          Collection Time: 06/07/21 12:26 AM         Result  Value  Ref Range            Glucose  Negative  NEGATIVE,Negative mg/dl       Bilirubin  Negative  NEGATIVE,Negative         Ketone  Negative  NEGATIVE,Negative mg/dl  Specific gravity  1.020  1.005 - 1.030         Blood  Negative  NEGATIVE,Negative         pH (UA)  6.0  5 - 9         Protein  Negative  NEGATIVE,Negative mg/dl       Urobilinogen  0.2  0.0 - 1.0 EU/dl       Nitrites  Negative  NEGATIVE,Negative         Leukocyte Esterase  Negative  NEGATIVE,Negative         Color  Yellow          Appearance  Clear          POC HCG,URINE          Collection Time: 06/07/21 12:27 AM         Result  Value  Ref Range            HCG urine, QL  positive (A)  NEGATIVE,Negative,negative            Labs Reviewed       POC HCG,URINE - Abnormal; Notable for the following components:            Result  Value            HCG urine, QL  positive (*)            All other components within normal limits       SARS-COV-2_FLU_RSV       POC URINE MACROSCOPIC              Imaging:     No results found.           ED Course        I have reviewed the patient's vital signs, available nursing notes, pertinent old medical records, past medical history, past surgical, family and social history.      Patient remained stable during the time they were under my care during this emergency department visit, and remained hemodynamically stable.      Other Considerations:      RECORDS & RESULTS  REVIEWED:  I reviewed the [facility] and available outside facilities and note that patient is very compliant through PCP, Dr. Susanne BordersStephen Hartline and has  minimal medical history that is positive for vitamin D deficiency and anemia.      Threat to body function without evaluation and management: Circulatory      SOCIAL DETERMINANTS  impacting Evaluation and Management: Patient is African-American female with her first pregnancy with lack of medical knowledge  and understanding.  Patient could also be subject to discrimination due to her race.                   Meds given during ER visit:   Medications - No data to display           Medical Decision Making        NARRATIVE: Urinalysis negative, urine pregnancy positive  Discussed with patient a true fever and chills with symptoms and recommended if that  occurs to treat with Tylenol and follow-up with PCP or return to ED for further evaluation and treatment.  Return to ED precautions discussed and all questions answered.            This patient was evaluated in the emergency department for symptoms described in the history of present illness. They were evaluated in the context of global  COVID-19 pandemic, which necessitated consideration of the patient may be at  risk for infection  with the SARS-COV-2 virus that causes COVID-19.  Institutional protocols and algorithms that pertain to the evaluation of patients at risk for COVID-19 are in a state of rapid change based on the information released by regulatory bodies including the  CDC and federal state organizations. These policies and algorithms were followed during the patient's care in the emergency department.             Final Diagnosis                 ICD-10-CM  ICD-9-CM          1.  Nausea without vomiting   R11.0  787.02          2.  Less than [redacted] weeks gestation of pregnancy   Z3A.01  V22.2             Disposition        Disposition and plan:   Patient was discharged home in stable  condition with discharge instructions on the same.       Return to the ER if condition worsens or new symptoms develop & follow up with primary care/specialist as discussed.       The patient was personally evaluated by myself and discussed with Loa Socks, MD whom agrees with the above assessment and plan.      Dragon medical dictation software was used for portions of this report. Unintended errors may occur.       Rosalie Doctor, MPA, PA-C   June 07, 2021      My signature above authenticates this document and my orders, the final     diagnosis (es), discharge prescription (s), and instructions in the Epic     record. If you have any questions please contact [phone].       Nursing notes have been reviewed by the physician/advanced practice     Clinician.

## 2021-06-10 IMAGING — CT CTA Chest-PE
2 of 6 series · 14 of 36 positions shown · IV contrast (omnipaque)
Comparison: CT pulmonary angiogram 05/18/2021

CTA Chest-PE
INDICATION: PE Chest Pain.                                                               
 Pertinent History: Warfarin overdose; Hypertension, Diabetic, PVD                         
 Surgical History:                                                                         
 Cancer: None                                                                              
 GFR (past 30 days): >39 ml/min                                                            
 Smoking status: Current                                                                   
 Intravenous contrast: 70 mL Omnipaque 350                                                 
 Technologist Comments: None
TECHNIQUE: Computed tomographic pulmonary angiography; helical acquisition without        
 cardiac gating; empiric delay for contrast timing; 3D angiographic MIP (maximum intensity 
 projection) reconstructions; sagittal and coronal reformats.                              
 Utilized dose reduction techniques include: Automated Exposure Control, vendor specific   
 iterative reconstruction technique

[Series 4: ax post chest-qa fov · axial · 0.62mm/px · z∈[-208,-35]mm · 11 of 209 slices shown]
[im 18/209  lung]
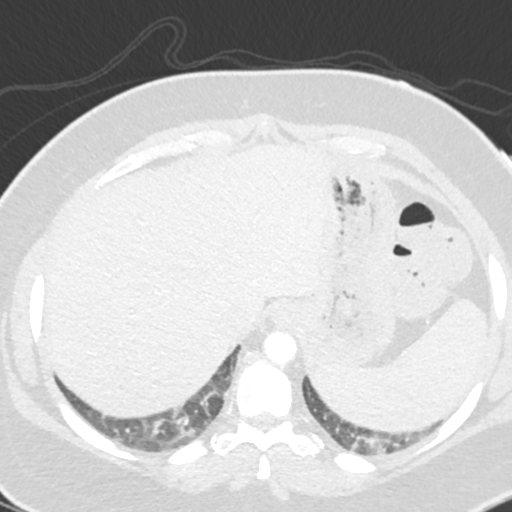
[im 35/209  mediastinal]
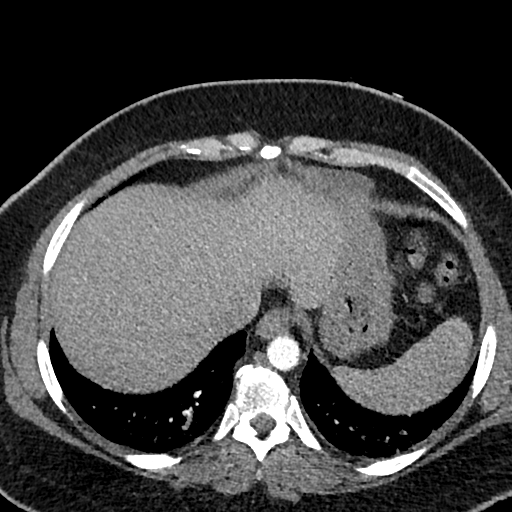
[im 53/209  lung]
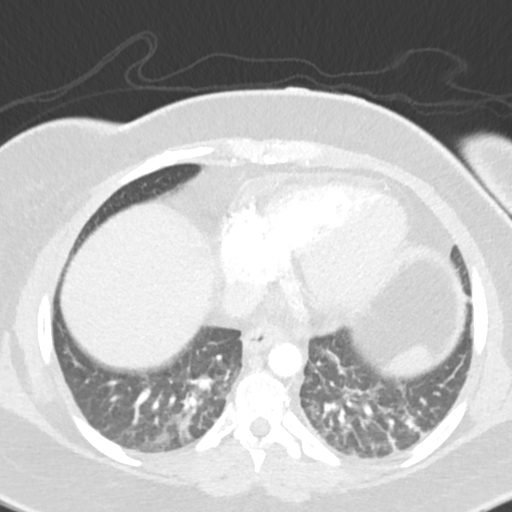
[im 70/209  mediastinal]
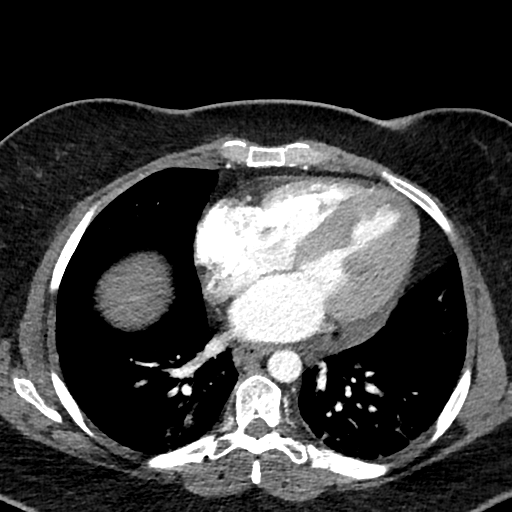
[im 87/209  lung]
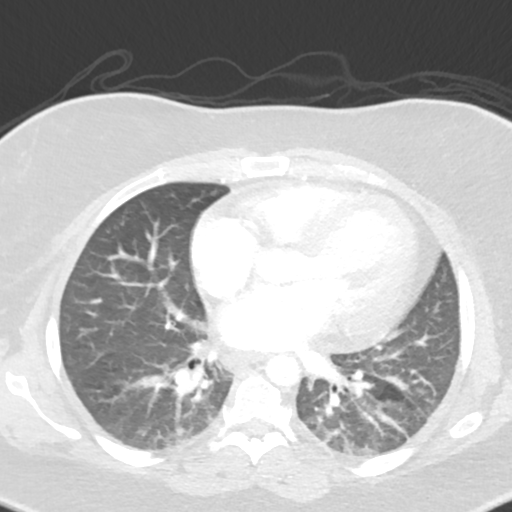
[im 105/209  mediastinal]
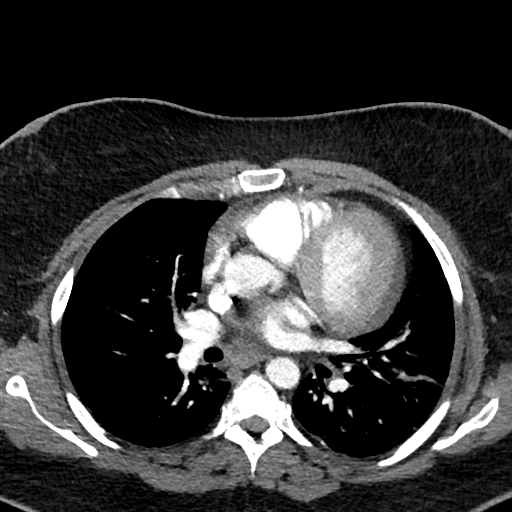
[im 122/209  lung]
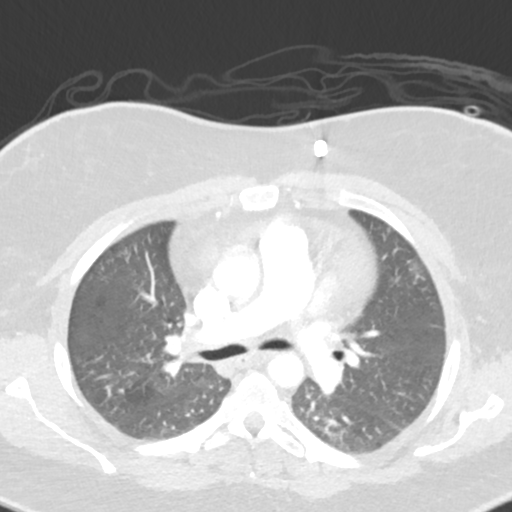
[im 139/209  mediastinal]
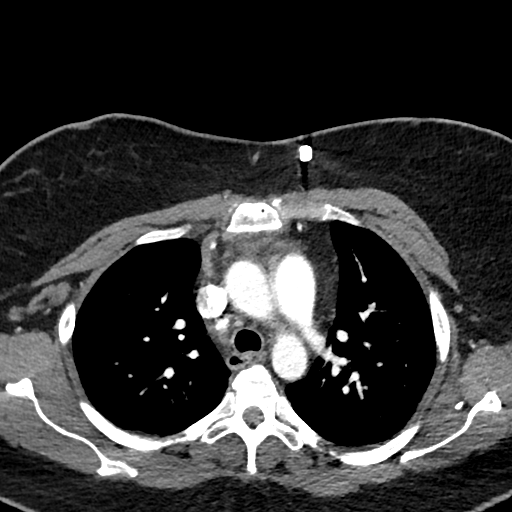
[im 157/209  lung]
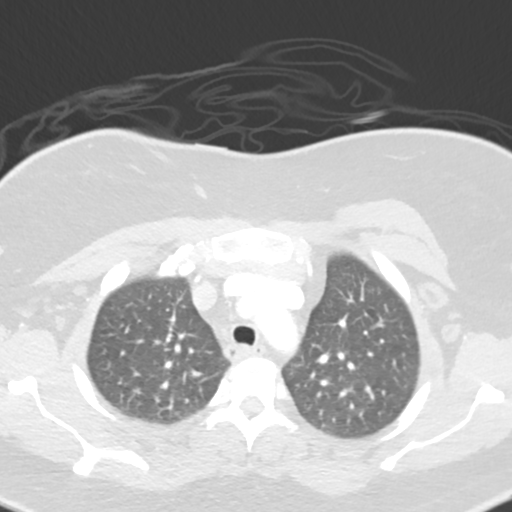
[im 174/209  mediastinal]
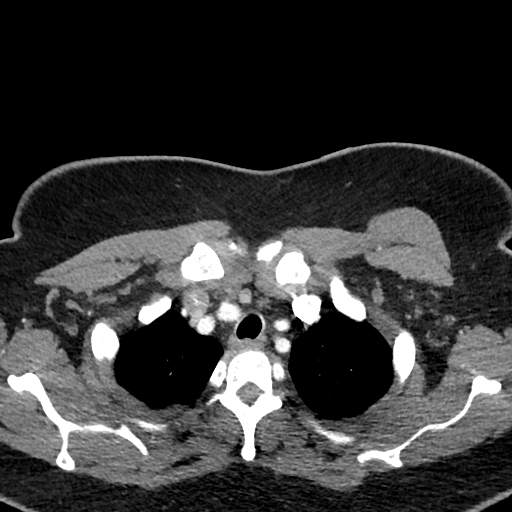
[im 191/209  lung]
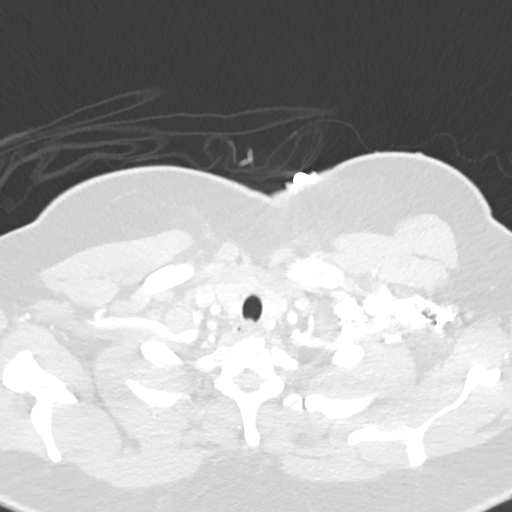

[Series 5: cor post chest · coronal · 0.44mm/px · 3 of 158 slices shown]
[im 32/158  mediastinal]
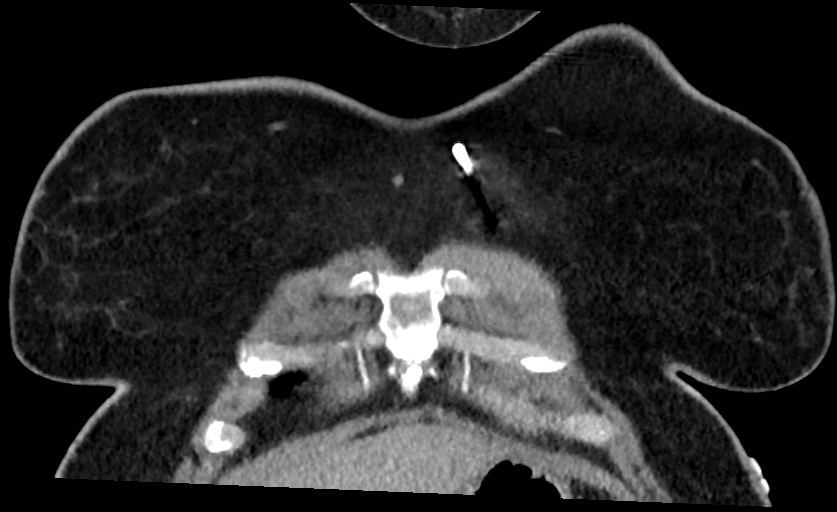
[im 63/158  mediastinal]
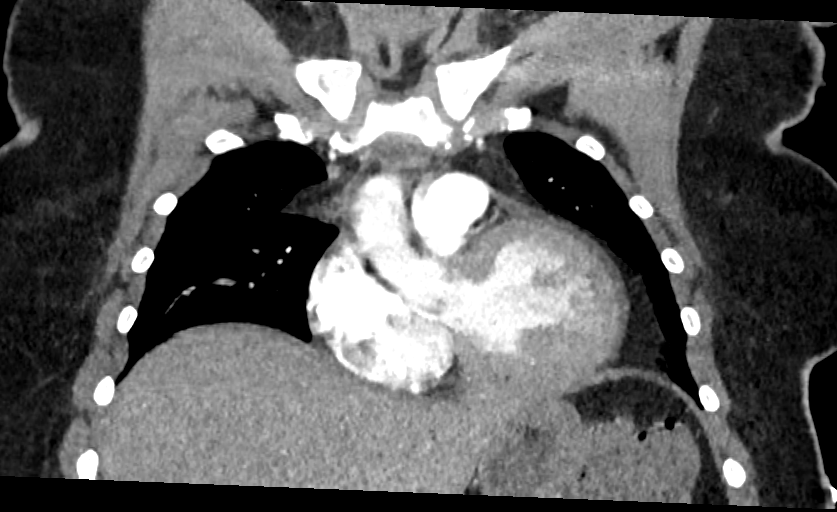
[im 95/158  mediastinal]
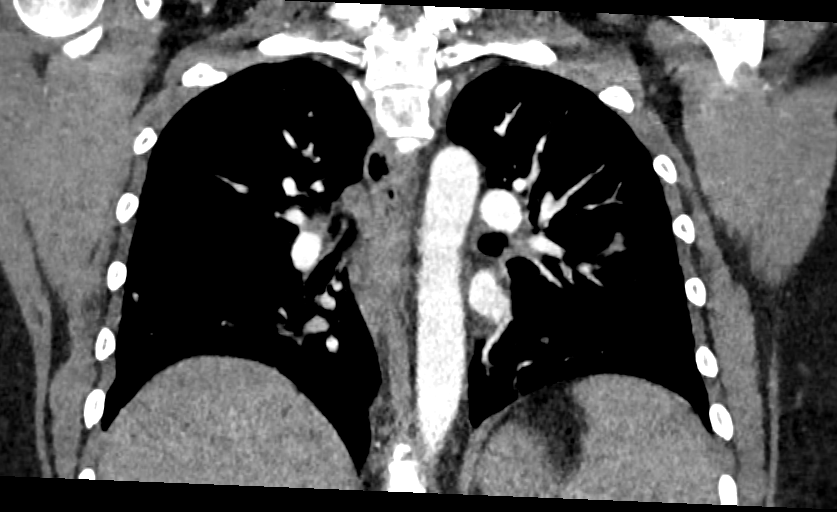

[14 of 36 positions shown; findings below may reference images not displayed]

FINDINGS: Limited views of the neck base are unremarkable.                                          
 There is adequate opacification of the pulmonary arteries to at least the proximal        
 segmental level, in which no filling defects are identified to suggest pulmonary arterial 
 embolism. There is motion artifact.                                                       
 No aneurysmal dilation or dissection of the thoracic aorta. Mild cardiomegaly. No         
 significant pericardial effusion.                                                         
 No pneumothorax or pleural effusion. The lungs are mildly hypoinflated. Mild mosaic       
 appearance of the lung parenchyma. Bilateral subsegmental atelectasis, most pronounced at 
 the bilateral posterior lungs. Few groundglass opacities at the periphery of the left     
 upper lobe and lingula. Small perifissural nodule along the right major fissure is        
 redemonstrated. Loop recorder at the medial left lower chest.                             
 No suspicious bony lesions. There are degenerative changes of the thoracic                
 spine.
IMPRESSION: No CT evidence for pulmonary arterial embolism.                                           
 Mosaic appearance of the lung parenchyma, suggesting small vessels/small airways          
 disease.                                                                                  
 The lungs are mildly hypoinflated. Few groundglass opacities in the left lung, suggesting 
 infectious/inflammatory etiology.

## 2021-06-11 IMAGING — CT CT Abdomen and Pelvis W-O Contrast
2 of 3 series · 16 of 46 positions shown, 18 images · IV contrast (agent unspecified)
Comparison: none

CT Abdomen and Pelvis W-O Contrast
INDICATION: Abdominal Pain.                                                              
 Pertinent History: warfarin OD. Abd pain, EROMEJ Diabetes, hypertension                      
 Surgical History: Tubal Ligation.                                                         
 Cancer: None                                                                              
 GFR (past 30 days): Not applicable                                                        
 Lipase: N/A       Amylase: N/A      WBC: N/A                                              
 Intravenous contrast: None                                                                
 Oral contrast: No                                                                         
 Technologist Comments: None
TECHNIQUE: Helical acquisition with sagittal and coronal reformations; without IV         
 contrast.                                                                                 
 Utilized dose reduction techniques include: Automated Exposure Control, vendor specific   
 iterative reconstruction technique

[Series 3: ax pre · axial · non-contrast · 0.79mm/px · z∈[-781,-366]mm · 13 of 97 slices shown, 15 images]
[im 7/97  soft-tissue]
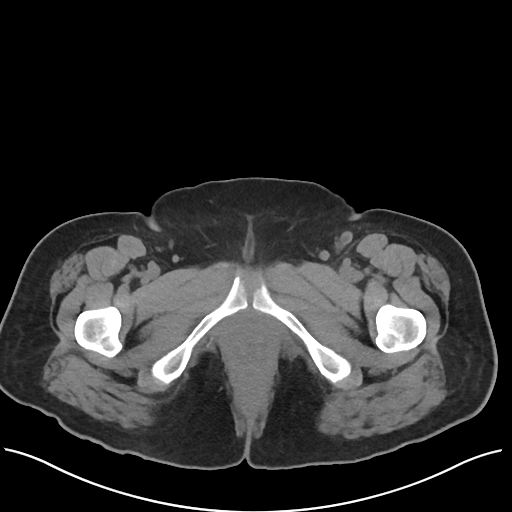
[im 7/97  bone]
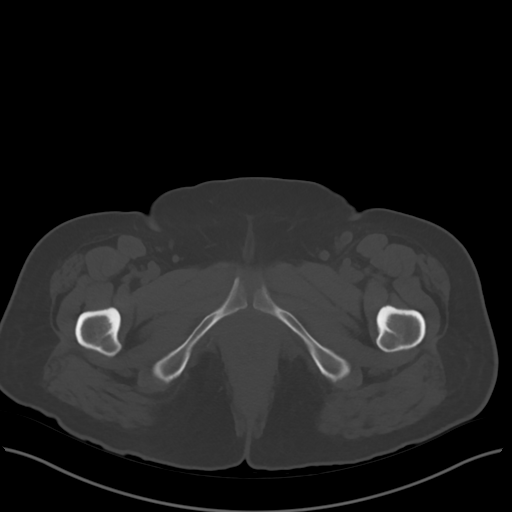
[im 13/97  soft-tissue]
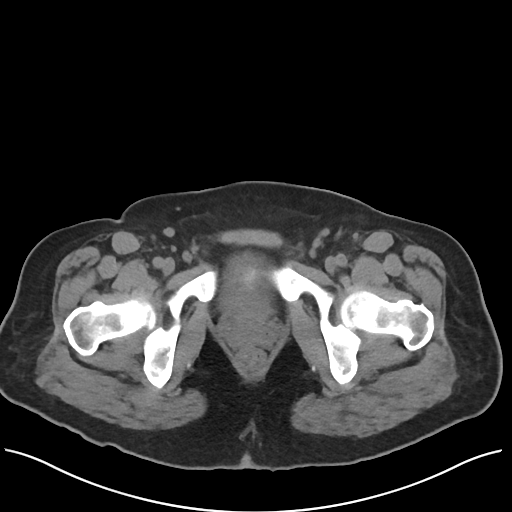
[im 19/97  soft-tissue]
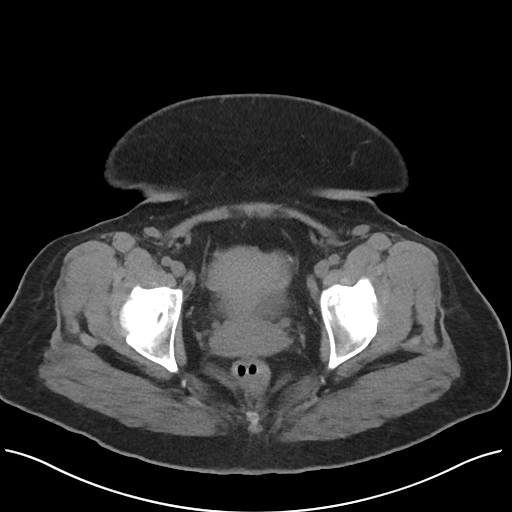
[im 28/97  soft-tissue]
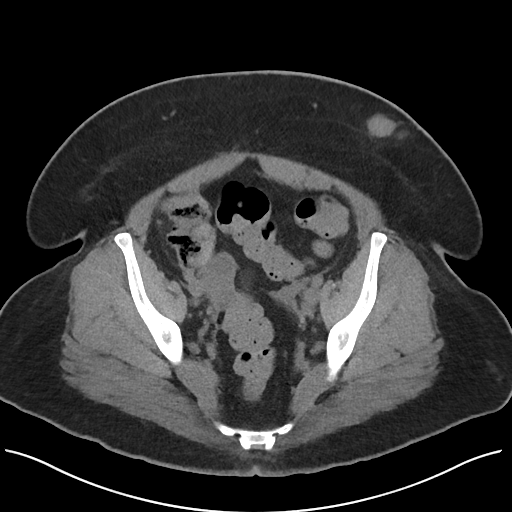
[im 35/97  soft-tissue]
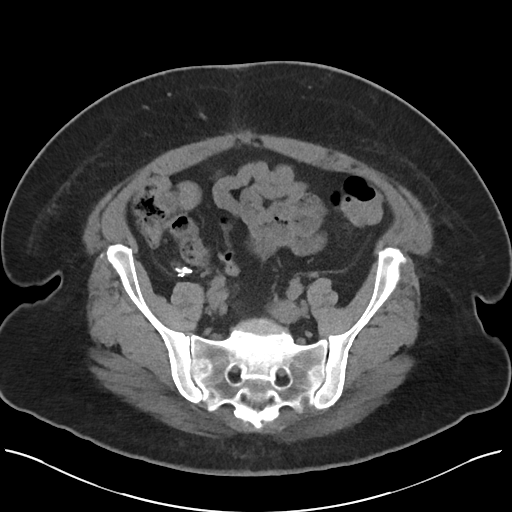
[im 41/97  soft-tissue]
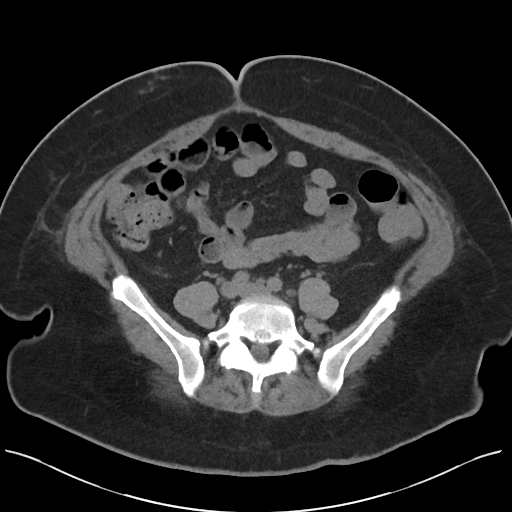
[im 50/97  soft-tissue]
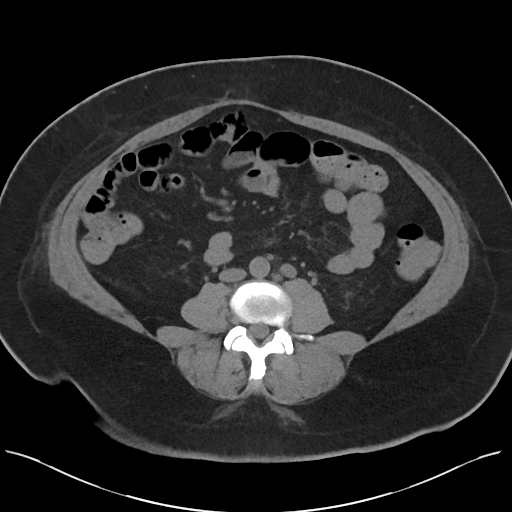
[im 56/97  soft-tissue]
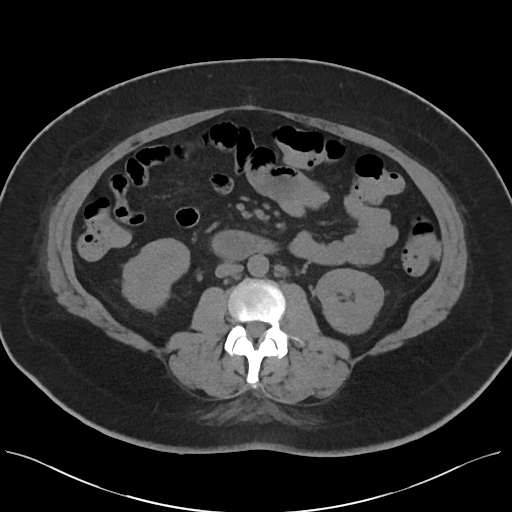
[im 62/97  soft-tissue]
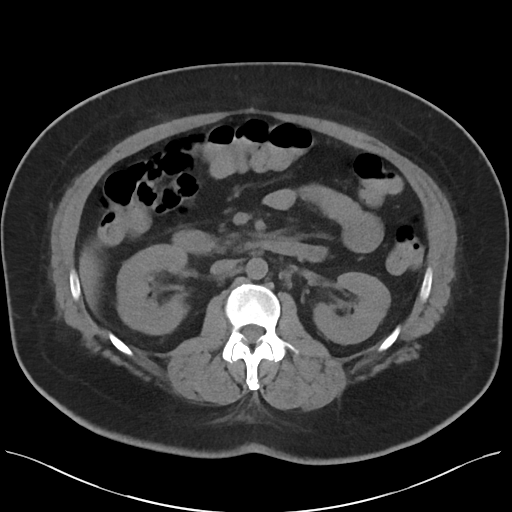
[im 62/97  bone]
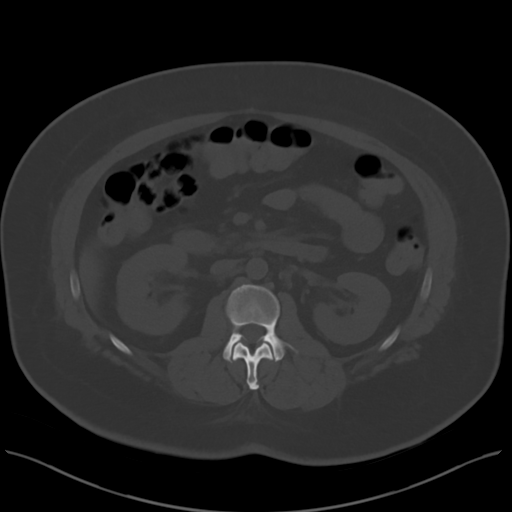
[im 69/97  soft-tissue]
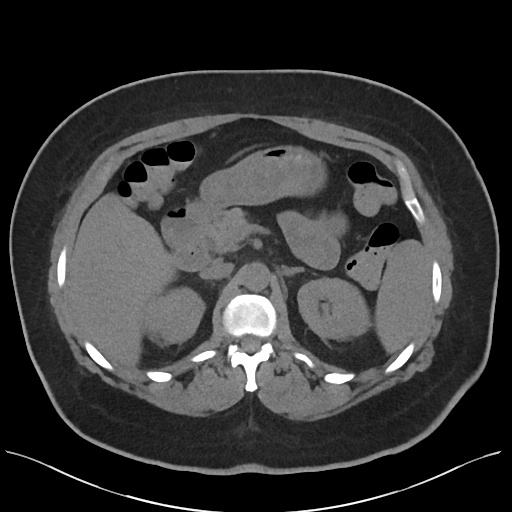
[im 78/97  soft-tissue]
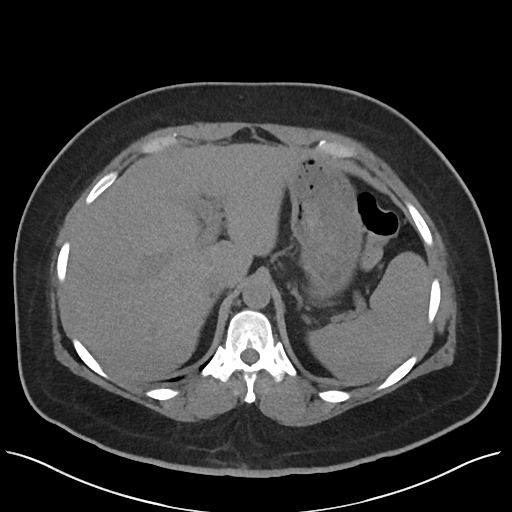
[im 84/97  soft-tissue]
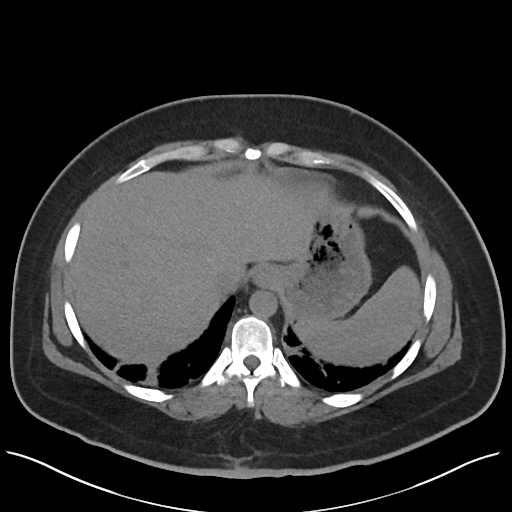
[im 90/97  soft-tissue]
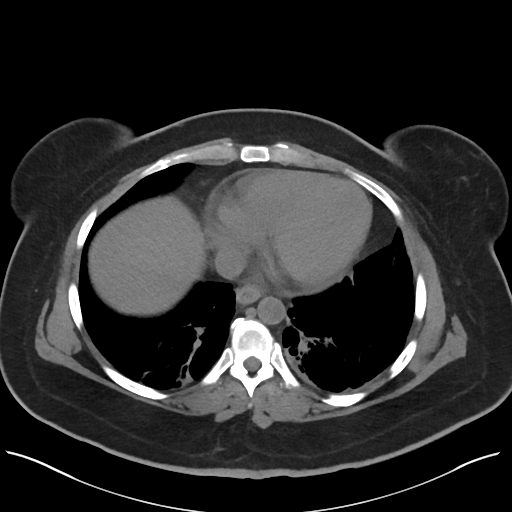

[Series 4: cor pre · coronal · non-contrast · 0.87mm/px · 3 of 68 slices shown]
[im 23/68  soft-tissue]
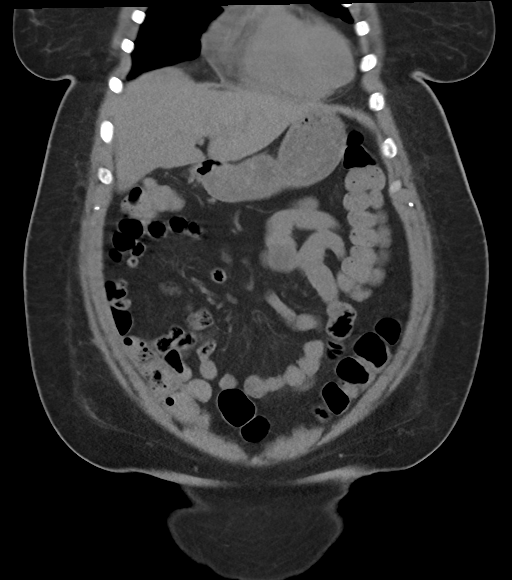
[im 30/68  soft-tissue]
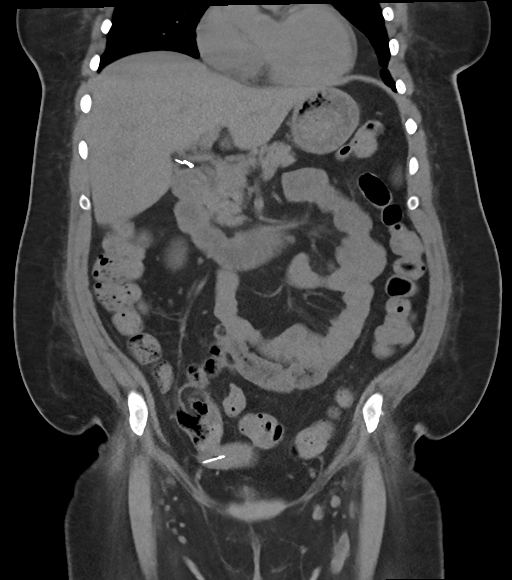
[im 38/68  soft-tissue]
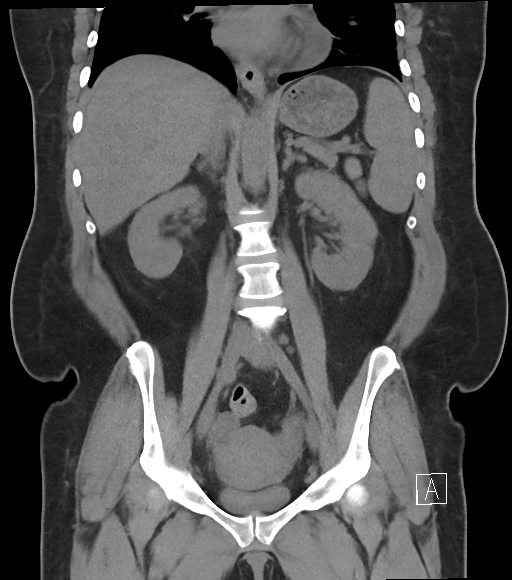

[16 of 46 positions shown; findings below may reference images not displayed]

FINDINGS: Lower lung zones show atelectasis.                                                        
 The liver spleen pancreas and adrenal glands are unremarkable in this nonenhanced         
 study.                                                                                    
 The gallbladder surgically absent. Bile ducts are not significantly dilated.              
 There are surgical changes of prior appendectomy.                                         
 The kidneys are normal in size with no stone and no hydronephrosis. No evidence of        
 ureteral calculus or obstruction. Faint residual contrast is seen within the collecting   
 systems ureters and bladder from a pulmonary embolism study the previous day.             
 However prominent left para-aortic lymph nodes.                                           
 Mildly prominent groin lymph nodes.                                                       
 Subcutaneous densities left anterior abdominal wall likely related to prior therapeutic   
 injections.                                                                               
 There are bilateral fallopian tube occlusive implants.                                    
 Small large bowel are nondistended. There is nonspecific fluid within small and large     
 bowel segments. No definite acute inflammatory changes seen in the abdomen or             
 pelvis.                                                                                   
 Nabothian cysts are seen in the cervix.
IMPRESSION: No definitive acute findings seen in the abdomen or pelvis.

## 2021-06-27 LAB — C. TRACHOMATIS, EXTERNAL RESULT: C. Trachomatis, External Result: NEGATIVE

## 2021-06-27 LAB — RPR, EXTERNAL RESULT: RPR, External Result: NEGATIVE

## 2021-06-27 LAB — HIV, EXTERNAL RESULT: HIV, External Result: NEGATIVE

## 2021-06-27 LAB — RUBELLA TITER, EXTERNAL RESULT

## 2021-06-27 LAB — N. GONORRHOEAE, EXTERNAL RESULT: N. Gonorrhoeae, External Result: NEGATIVE

## 2021-06-27 LAB — HEPATITIS B, EXTERNAL RESULT: Hep B, External Result: NEGATIVE

## 2021-07-23 IMAGING — CR DX Chest X-Ray Portable
1 series · 1 of 1 positions shown · non-contrast
Comparison: none

Single portable chest. Pain.                                                              
 Comparison made to exam of 07/14/2021.                                                     
 The cardiac silhouette, mediastinum, and pulmonary vasculature appear within normal       
 limits. No focal infiltrates or effusions. No pneumothorax.

[chest ap vg]
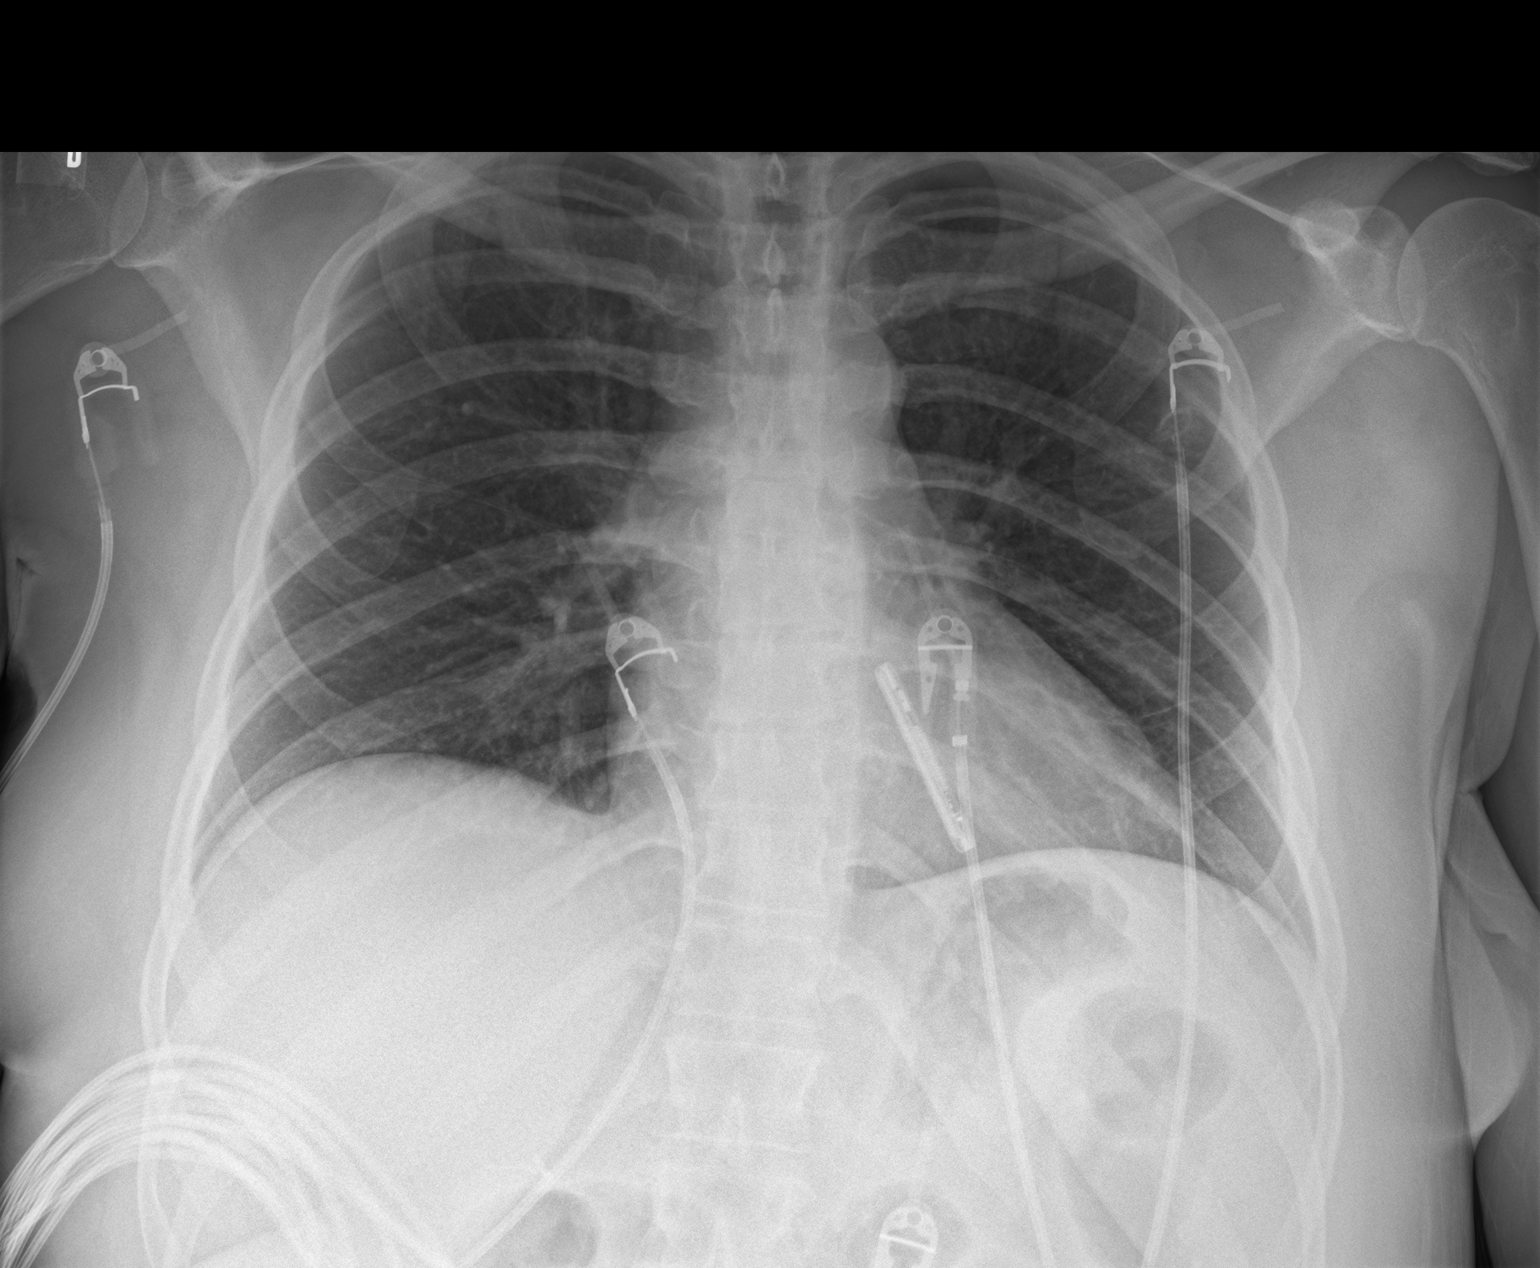

[1 of 1 positions shown; findings below may reference images not displayed]

IMPRESSION: 1. No active pulmonary disease noted.

## 2021-08-13 IMAGING — CT CT Brain W-O Contrast
3 of 4 series · 15 of 47 positions shown, 18 images · non-contrast
Comparison: Head CT 08/07/2021

CT Brain W-O Contrast
INDICATION: Neuro deficit, acute, stroke suspected.                                      
 Pertinent History: having CP at 0950 that radiates down left arm, into back, and down     
 neck                                                                                      
 Surgical History:                                                                         
 Cancer: None                                                                              
 Technologist Comments: None.
TECHNIQUE: Helical acquisition with sagittal and coronal reformats.                       
 Utilized dose reduction techniques include: Automated Exposure Control, vendor specific   
 iterative reconstruction technique

[Series 2: ax pre · axial · non-contrast · 0.45mm/px · z∈[+1254,+1398]mm · 9 of 56 slices shown, 12 images]
[im 4/56  brain]
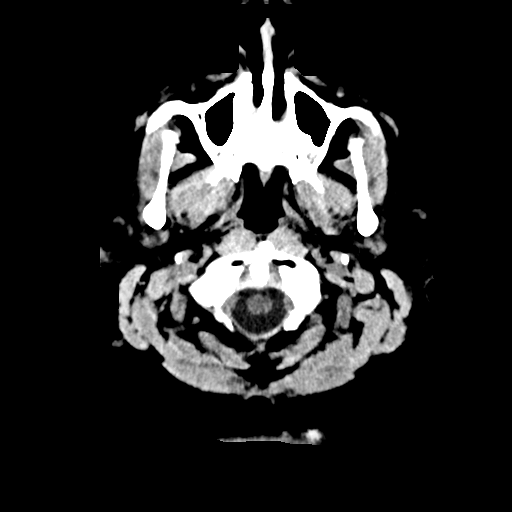
[im 4/56  bone]
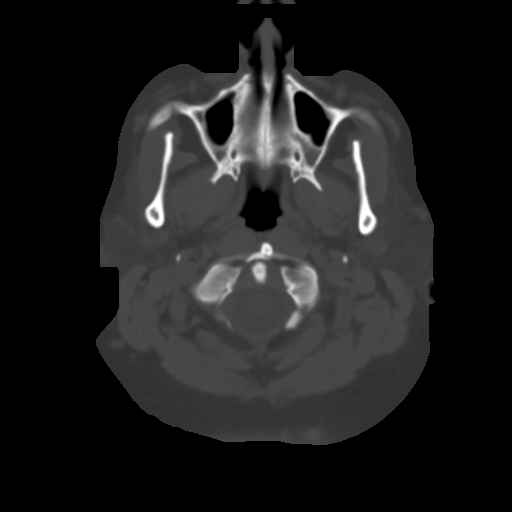
[im 12/56  brain]
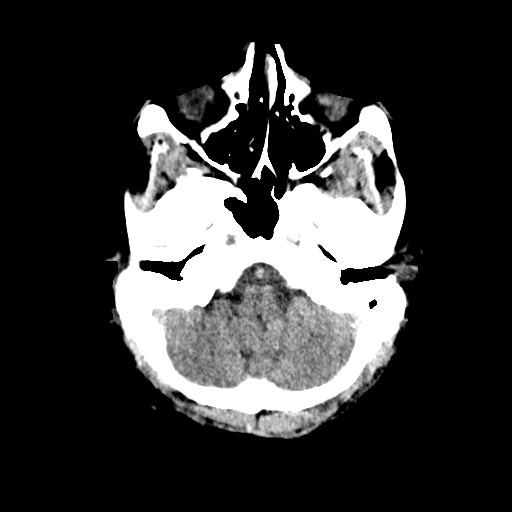
[im 16/56  brain]
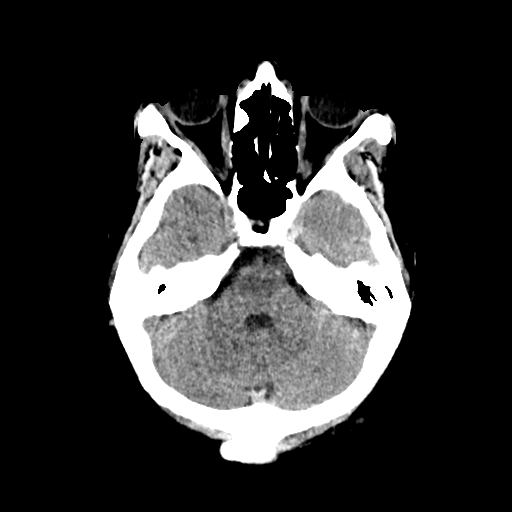
[im 24/56  brain]
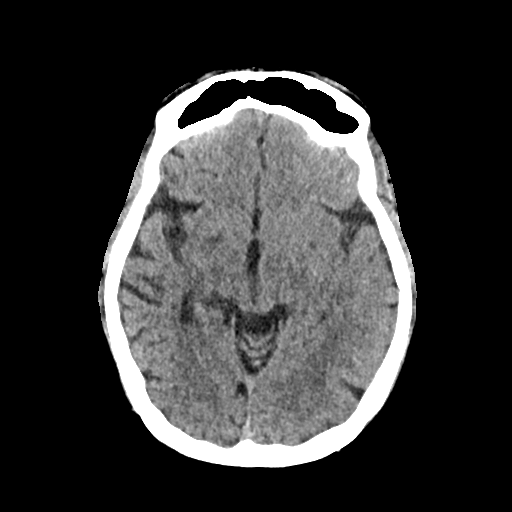
[im 28/56  brain]
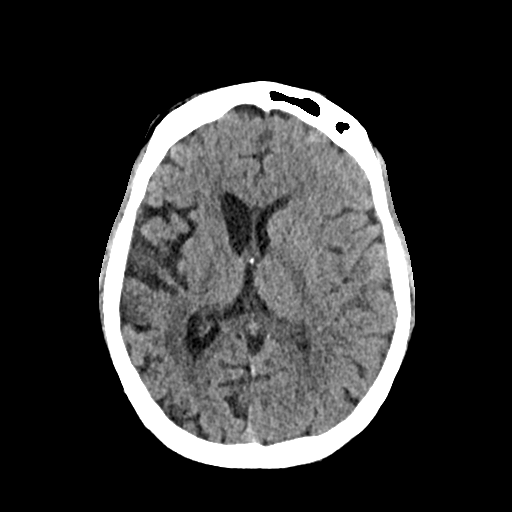
[im 28/56  bone]
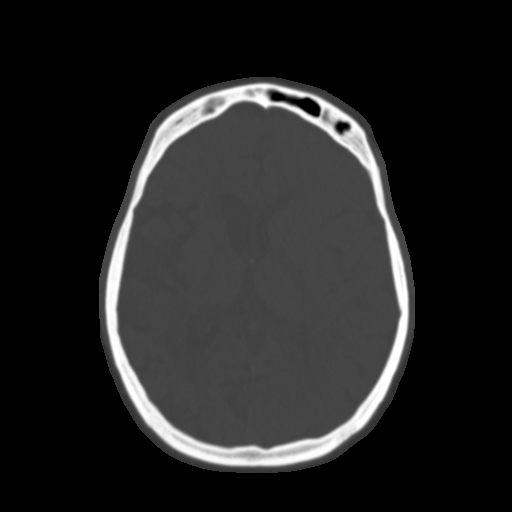
[im 32/56  brain]
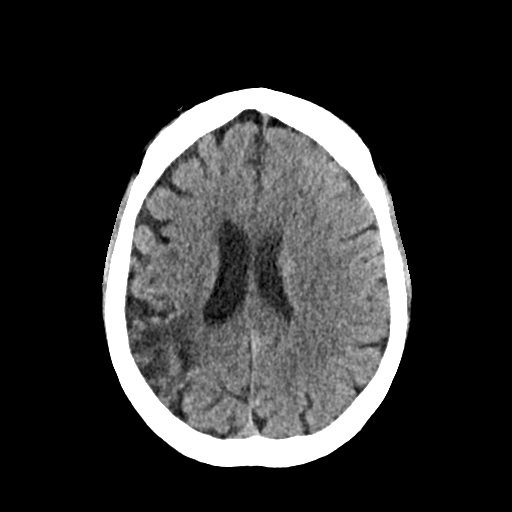
[im 40/56  brain]
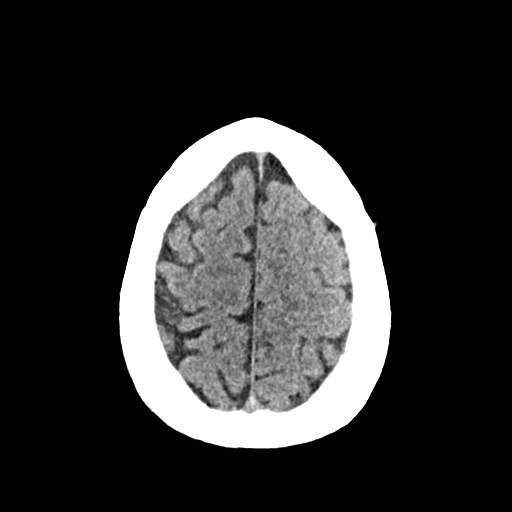
[im 44/56  brain]
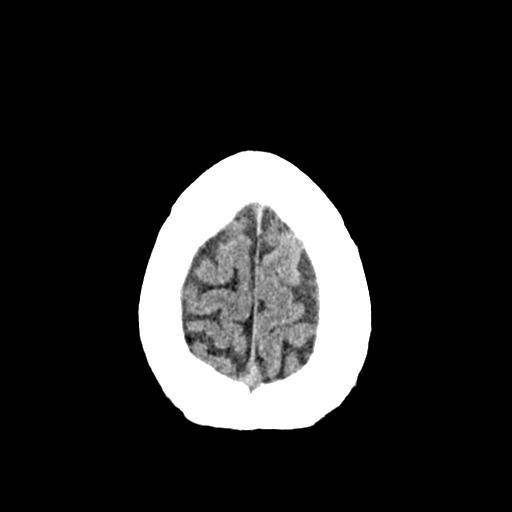
[im 52/56  brain]
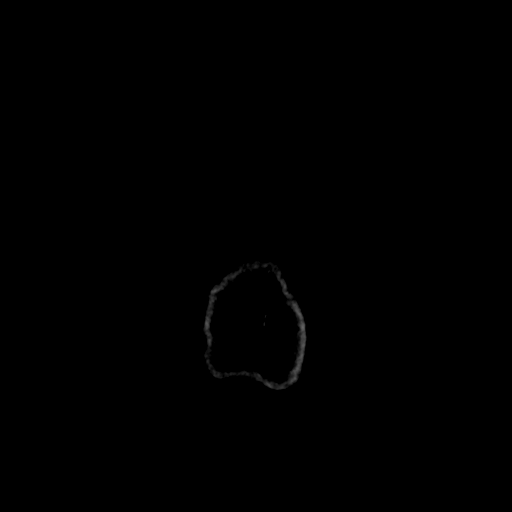
[im 52/56  bone]
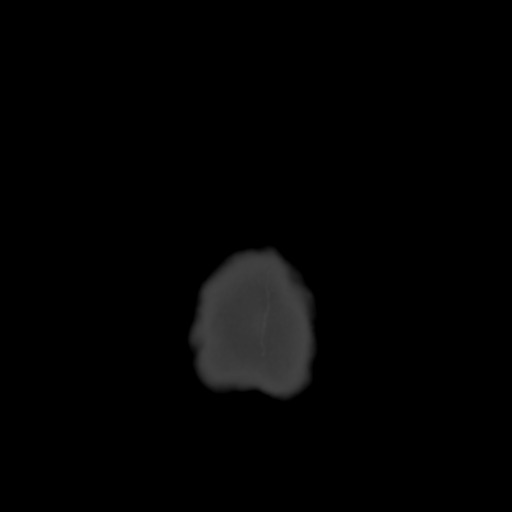

[Series 4: cor pre · coronal · non-contrast · 0.35mm/px · 3 of 66 slices shown]
[im 22/66  brain]
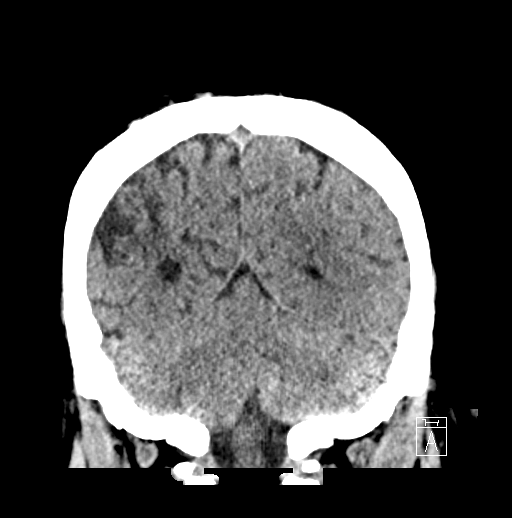
[im 29/66  brain]
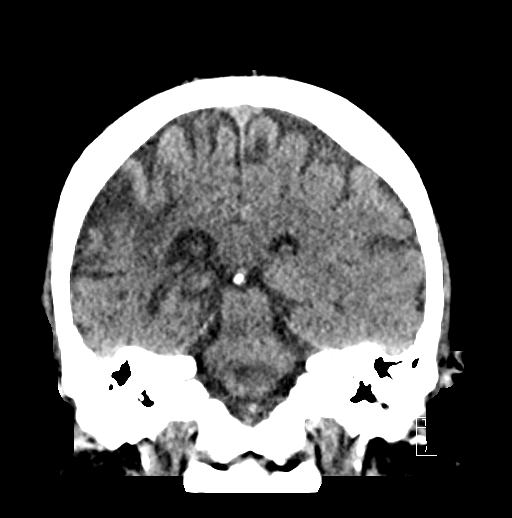
[im 37/66  brain]
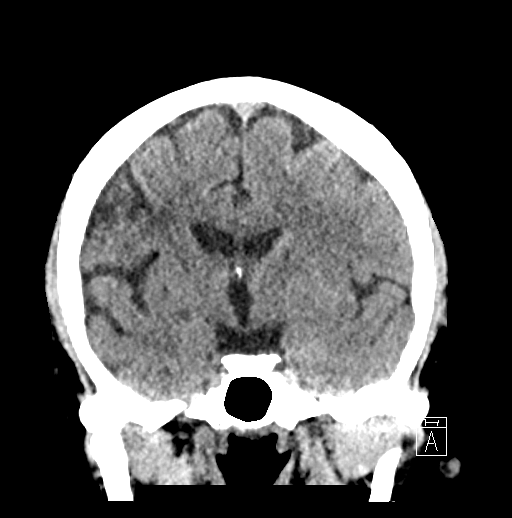

[Series 6: sag pre · sagittal · non-contrast · 0.35mm/px · 3 of 69 slices shown]
[im 23/69  brain]
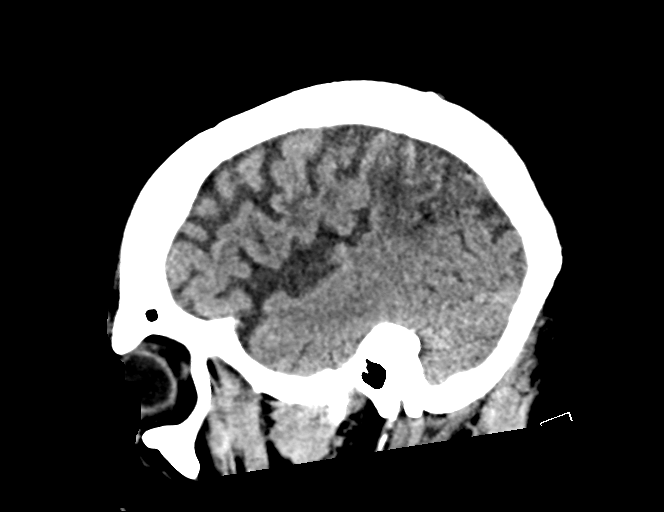
[im 35/69  brain]
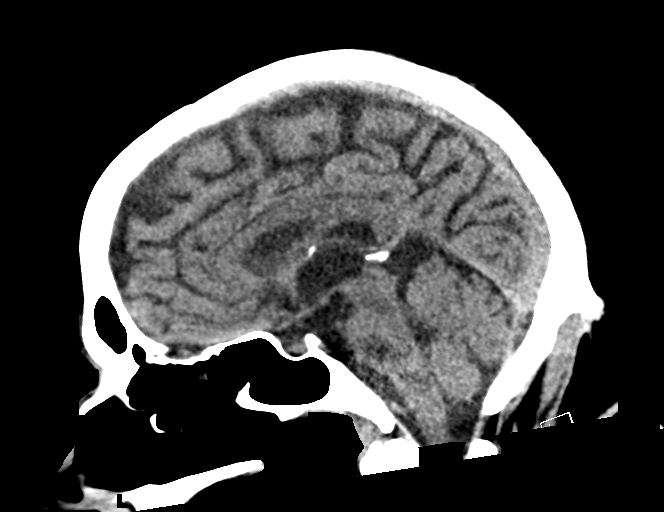
[im 46/69  brain]
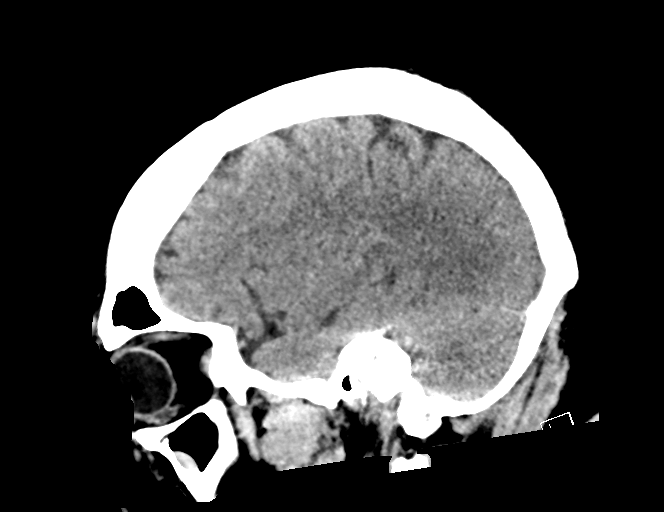

[15 of 47 positions shown; findings below may reference images not displayed]

FINDINGS: No parenchymal hemorrhage or extra-axial fluid collection. No hydrocephalus or midline    
 shift. No intracranial mass or acute territorial infarct is identified on noncontrast     
 head CT. Encephalomalacia at the right parietal lobe and slight ex vacuo dilation of the  
 right lateral ventricle again noted. Remote infarct at the right basal ganglion.          
 The bony calvarium is intact. Mild mucosal thickening at the left maxillary sinus.        
 Mucosal thickening within an anterior right ethmoid air cells. Remainder of the           
 visualized paranasal sinuses and mastoid air cells are essentially                        
 clear.
IMPRESSION: Chronic ischemic changes without acute abnormality noted on head CT.

## 2021-08-13 IMAGING — CT CTA Chest-PE
2 of 6 series · 13 of 36 positions shown · IV contrast (Omnipaque)
Comparison: none

CTA Chest-PE
INDICATION: Difficulty Breathing.                                                        
 Pertinent History: mid chest pain, CVA hx, PVD, HTP. Diabetic                             
 Surgical History: none                                                                    
 Cancer: None                                                                              
 GFR (past 30 days): >39 ml/min                                                            
 Smoking status: Current                                                                   
 Intravenous contrast: 70 mL Omnipaque 350                                                 
 Technologist Comments: None
TECHNIQUE: Computed tomographic pulmonary angiography; helical acquisition without        
 cardiac gating; empiric delay for contrast timing; 3D angiographic MIP (maximum intensity 
 projection) reconstructions; sagittal and coronal reformats.                              
 Utilized dose reduction techniques include: Automated Exposure Control, vendor specific   
 iterative reconstruction technique.                                                       
 Comparison made to exam of 06/10/2021.                                                     
 No evidence of pulmonary embolism or aortic dissection. Minimal bibasilar atelectasis. No 
 pleural effusions. No pneumothorax. No significant coronary artery calcifications or      
 pericardial effusion. There is a small hiatal hernia. Small calcified granuloma right     
 middle lobe along the lateral aspect of the major fissure. Prior cholecystectomy. Likely  
 fatty infiltration of the liver.

[Series 6: ax post chest-qa fov · axial · 0.75mm/px · z∈[+860,+1119]mm · 10 of 317 slices shown]
[im 29/317  lung]
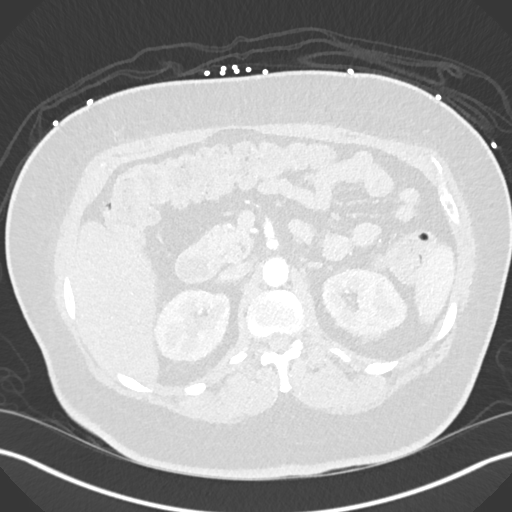
[im 58/317  mediastinal]
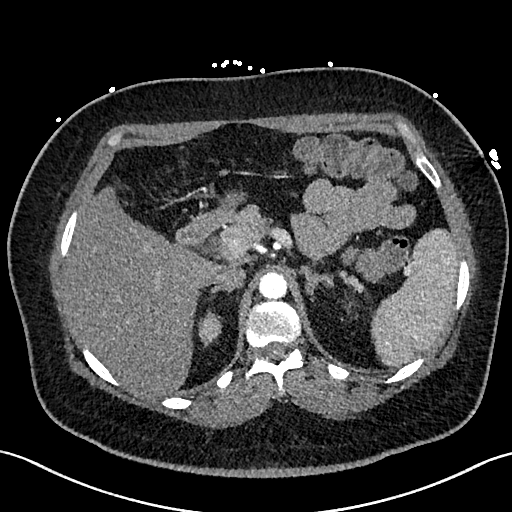
[im 87/317  lung]
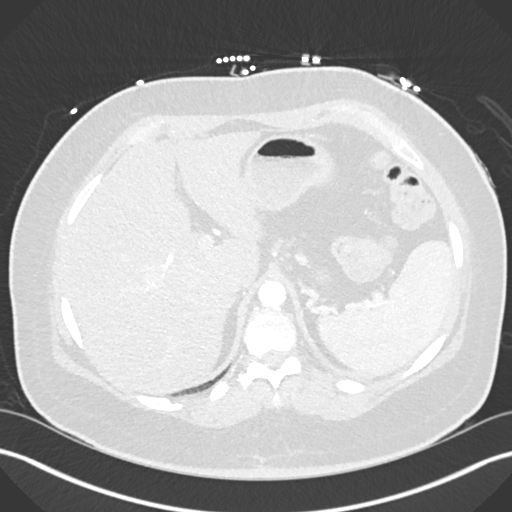
[im 115/317  mediastinal]
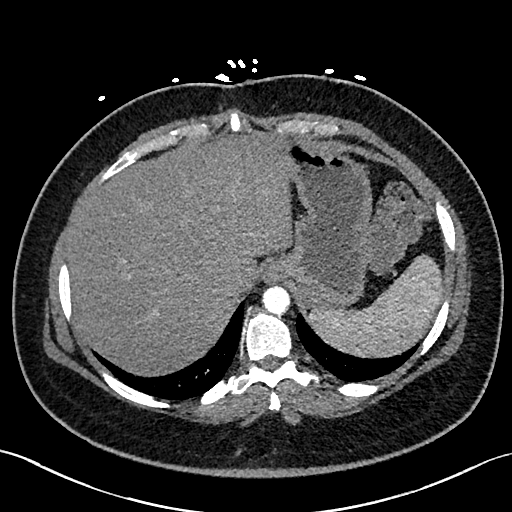
[im 144/317  lung]
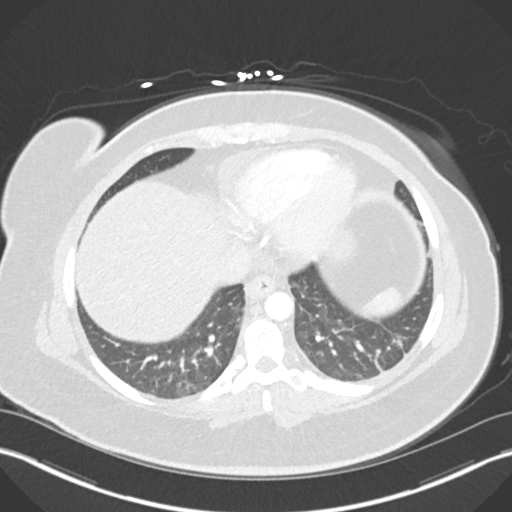
[im 173/317  mediastinal]
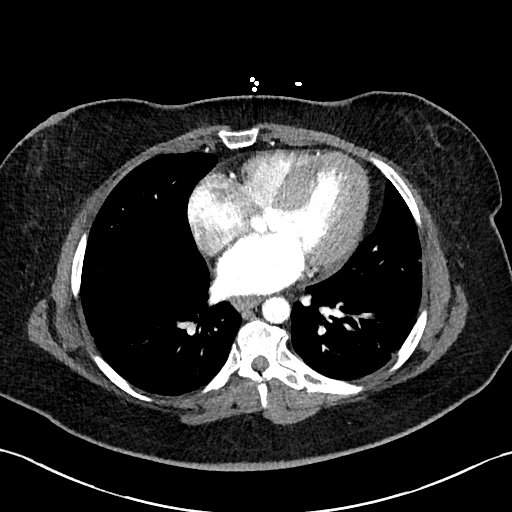
[im 202/317  lung]
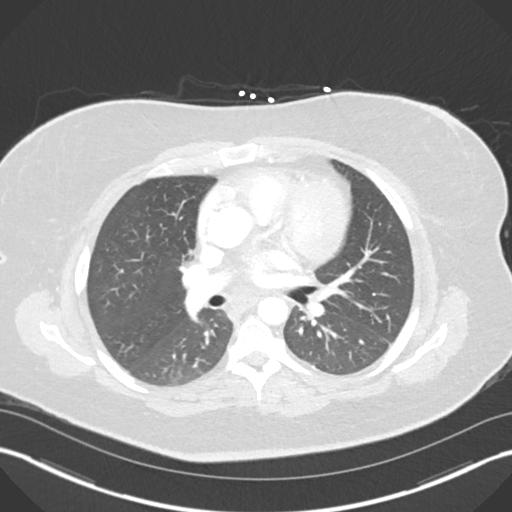
[im 230/317  mediastinal]
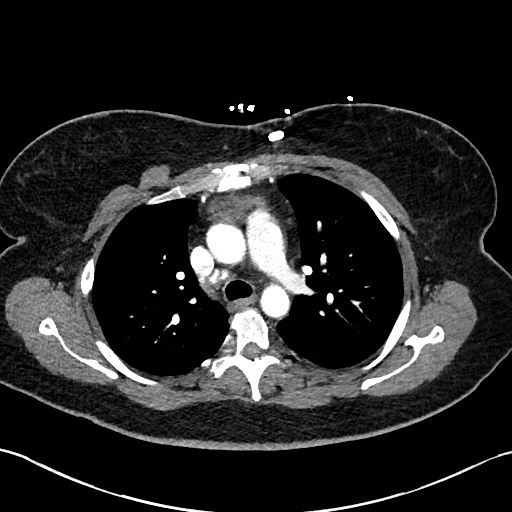
[im 259/317  lung]
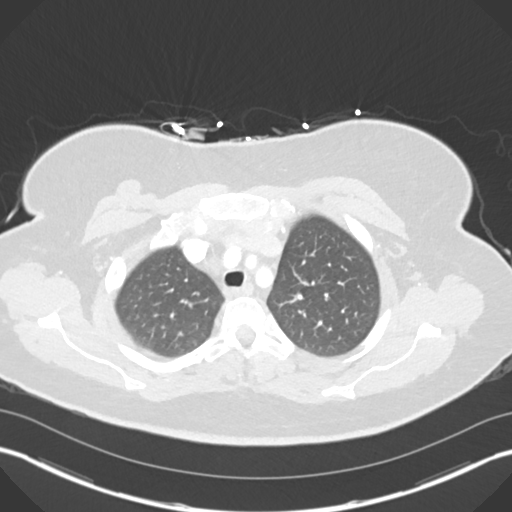
[im 288/317  mediastinal]
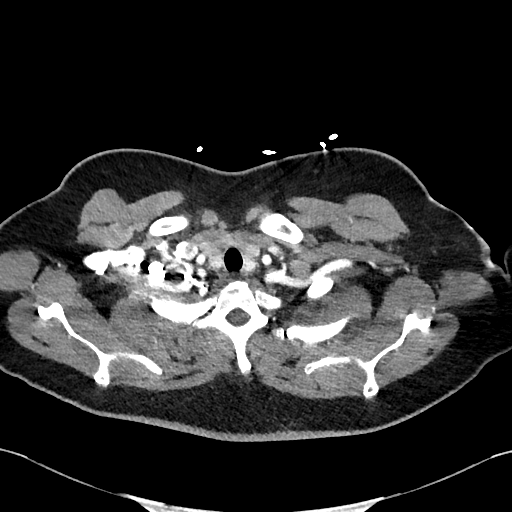

[Series 7: cor post chest · coronal · 0.61mm/px · 3 of 158 slices shown]
[im 32/158  mediastinal]
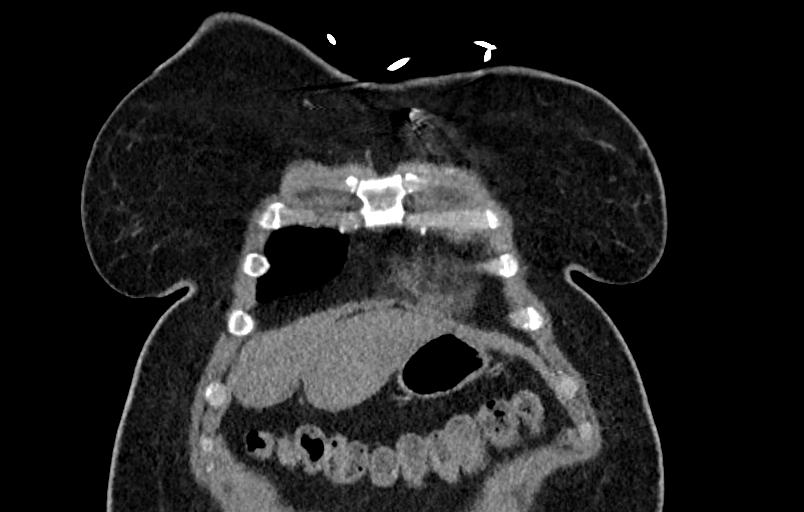
[im 63/158  mediastinal]
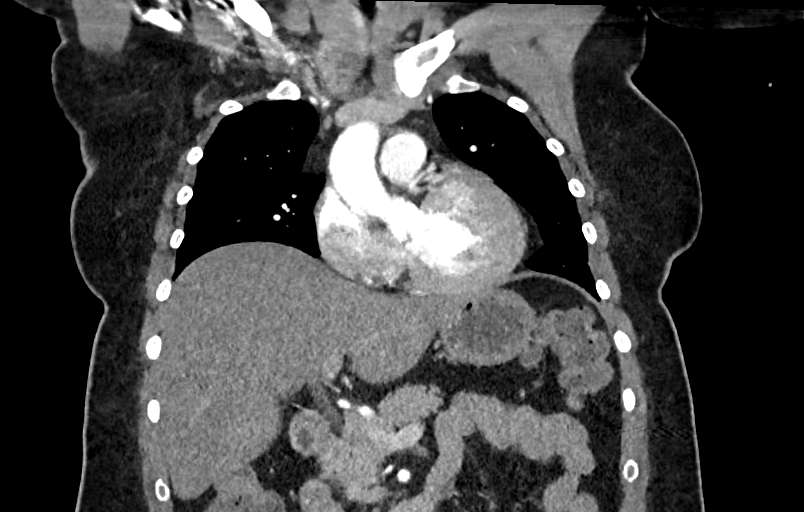
[im 95/158  mediastinal]
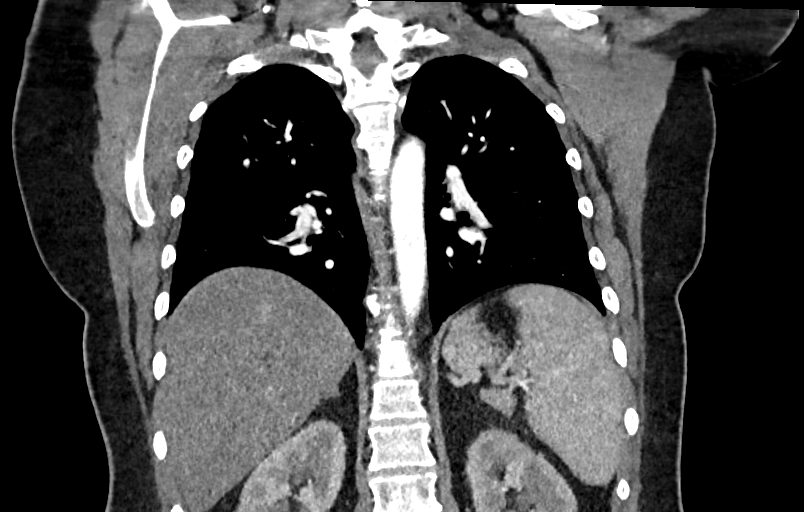

[13 of 36 positions shown; findings below may reference images not displayed]

IMPRESSION: 1. No evidence of pulmonary embolism or aortic dissection. Minimal basal atelectasis.     
 2. Fatty infiltration of the liver.

## 2021-08-17 IMAGING — CT CTA Chest Dissection
2 of 6 series · 16 of 36 positions shown · IV contrast (omnipaque)
Comparison: none

CTA Chest Dissection
INDICATION: Evaluate for Dissection.                                                     
 Pertinent History: back pain                                                              
 Surgical History: Cholecystectomy.  Tubal ligation.                                       
 Cancer: None                                                                              
 GFR (past 30 days): Not applicable                                                        
 Smoking status:                                                                           
 Intravenous contrast: 100 mL Omnipaque 350                                                
 Technologist Comments: None
TECHNIQUE: Computed tomographic angiography; empiric delay for contrast timing; 3D        
 angiographic MIP (maximum intensity projection) reconstructions; sagittal and coronal     
 reformats. Pre and post IV contrasted images.                                             
 Utilized dose reduction techniques include: Automated Exposure Control, vendor specific   
 iterative reconstruction technique                                                        
 CTA CHEST FINDINGS:                                                                       
 Normal heart size. Trace pericardial effusion. Negative for thoracic aortic aneurysm. No  
 evidence of acute intramural hematoma. Three-vessel aortic arch.                          
 Motion degraded exam from cardiac pulsation artifact with no evidence of dissection or    
 acute or suspicious findings of the aorta. No evidence of pulmonary embolism to the first 
 segmental pulmonary artery level. No appreciable coronary and minimal aortoiliac          
 atherosclerosis.                                                                          
 ?                                                                                         
 CTA ABDOMEN/PELVIS FINDINGS:                                                              
 Negative for abdominal aortic aneurysm for acute intramural hematoma. Negative for        
 dissection or acute/suspicious findings of the aorta. Celiac artery origin is patent.     
 Celiac artery supplies the common hepatic and left gastric arteries. Superior mesenteric  
 artery origin is patent. Superior mesenteric artery supplies the splenic artery. Superior 
 superior mesenteric artery and major branches are grossly patent. There are 2 right and a 
 one left renal arteries. Renal artery origins are patent. Renal arteries are grossly      
 patent with normal contour. Inferior mesenteric artery is patent. Bilateral common,       
 external, and internal iliac arteries are patent. No significant atherosclerosis.         
 Other findings:                                                                           
 Small hiatal hernia. Negative for pleural effusion. Negative for pneumothorax. Central    
 airways are patent. Mild subsegmental atelectasis. No focal consolidation. There are      
 calcified lung nodules reflective of prior granulomatous disease. Negative for thoracic   
 adenopathy. Surgically absent gallbladder and appendix. Moderate volume stool in the      
 colon. No acute or suspicious findings of the abdomen or pelvis. Overall mild             
 degenerative changes of the spine.                                                        
 Key: (S/I) = series number / image number                                                 
 ____________

[Series 7: ax post c/a/p · axial · 0.77mm/px · z∈[-552,-16]mm · 13 of 417 slices shown]
[im 30/417  lung]
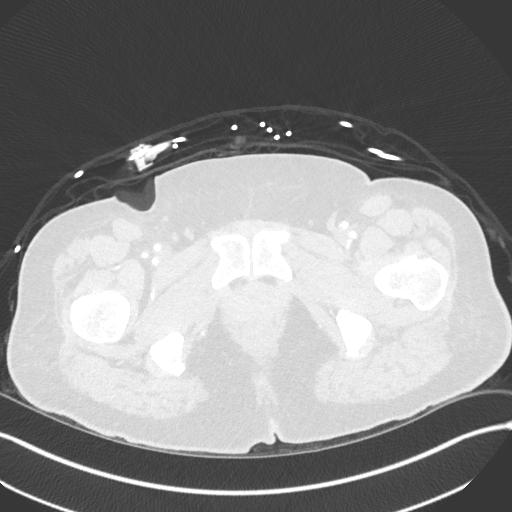
[im 60/417  mediastinal]
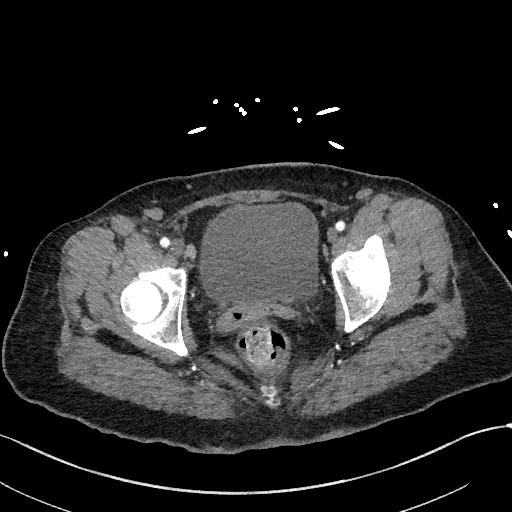
[im 90/417  lung]
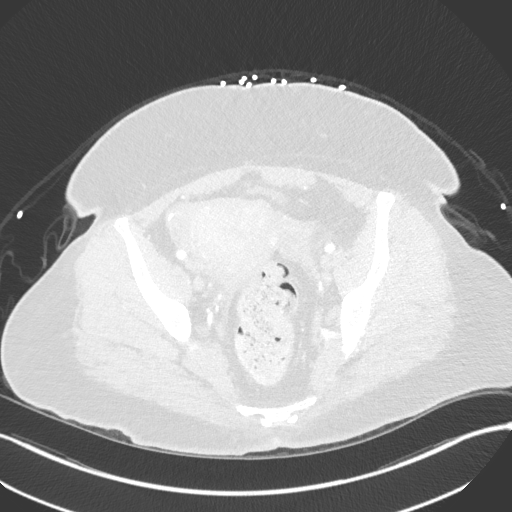
[im 119/417  mediastinal]
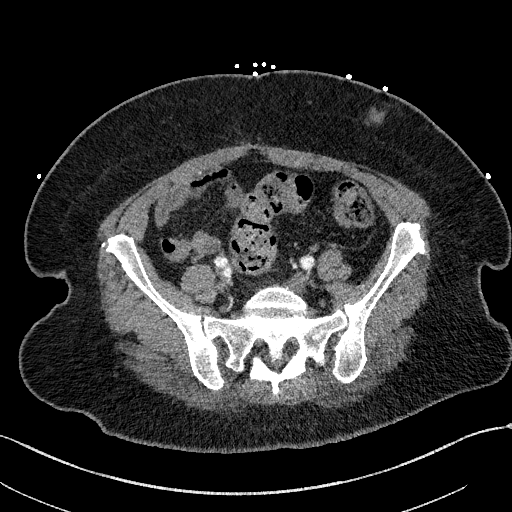
[im 149/417  lung]
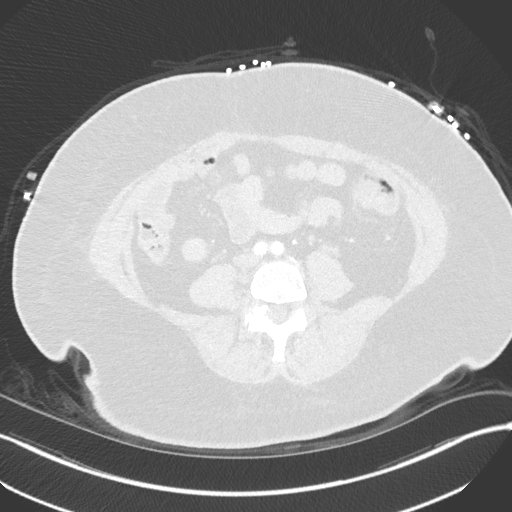
[im 179/417  mediastinal]
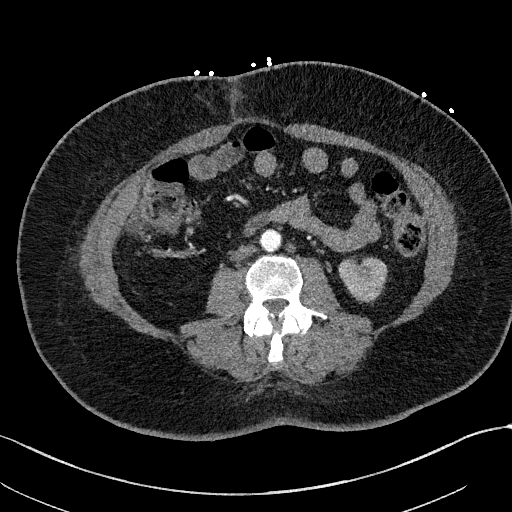
[im 209/417  lung]
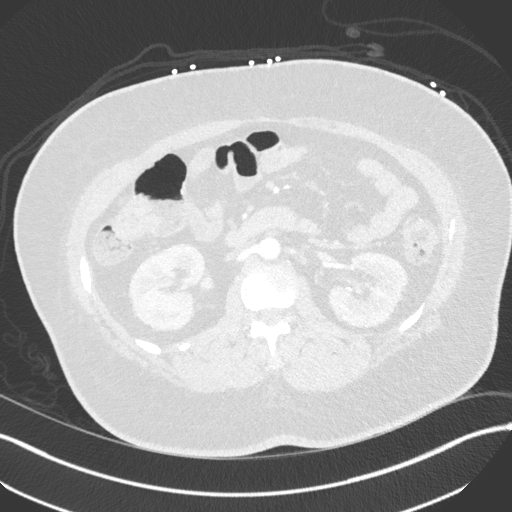
[im 238/417  mediastinal]
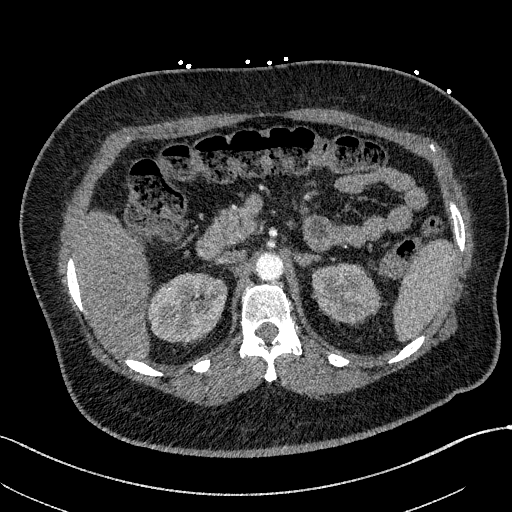
[im 268/417  lung]
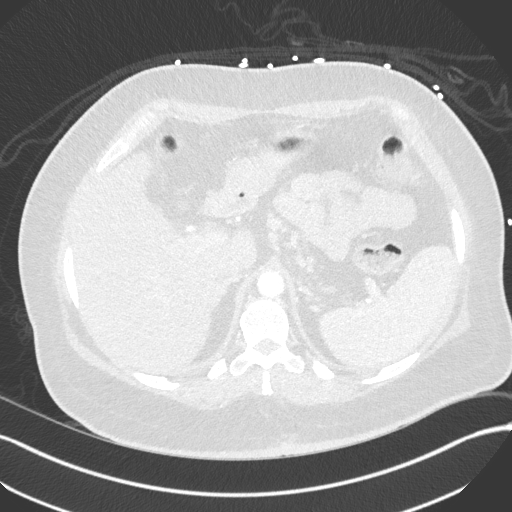
[im 298/417  mediastinal]
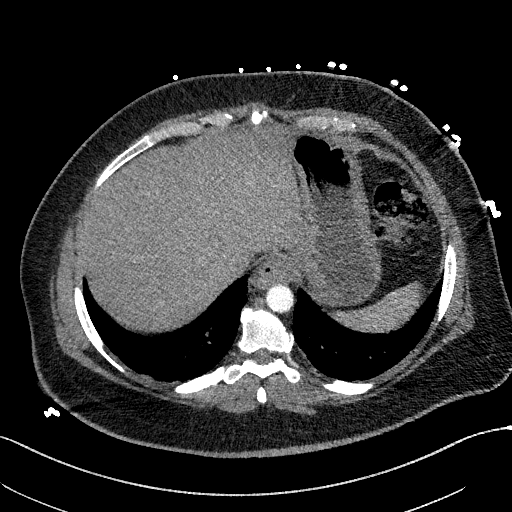
[im 327/417  lung]
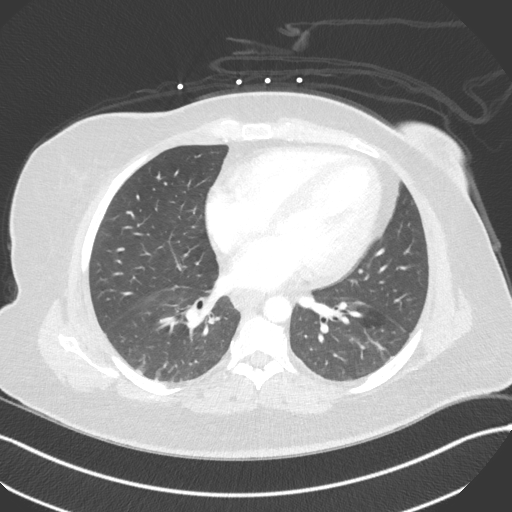
[im 357/417  mediastinal]
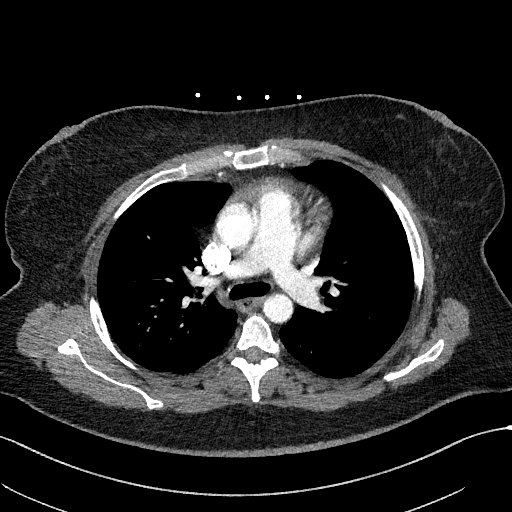
[im 387/417  lung]
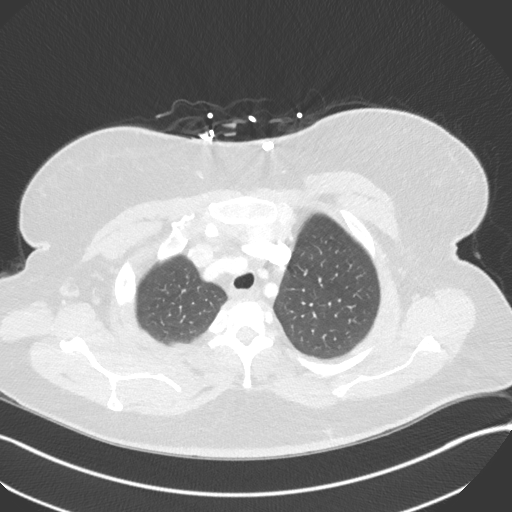

[Series 8: cor 2x2 · coronal · 0.85mm/px · 3 of 156 slices shown]
[im 32/156  mediastinal]
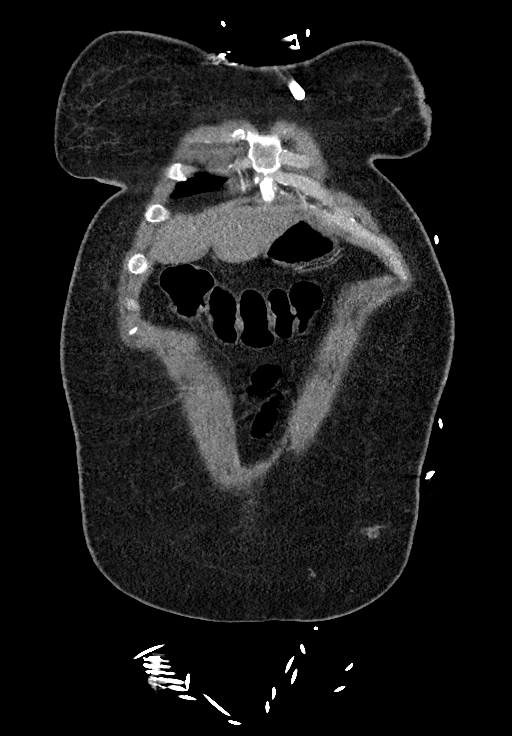
[im 63/156  mediastinal]
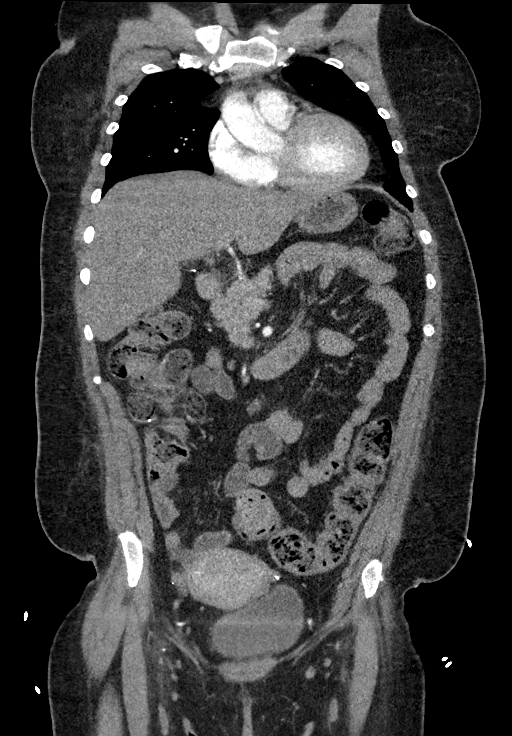
[im 94/156  mediastinal]
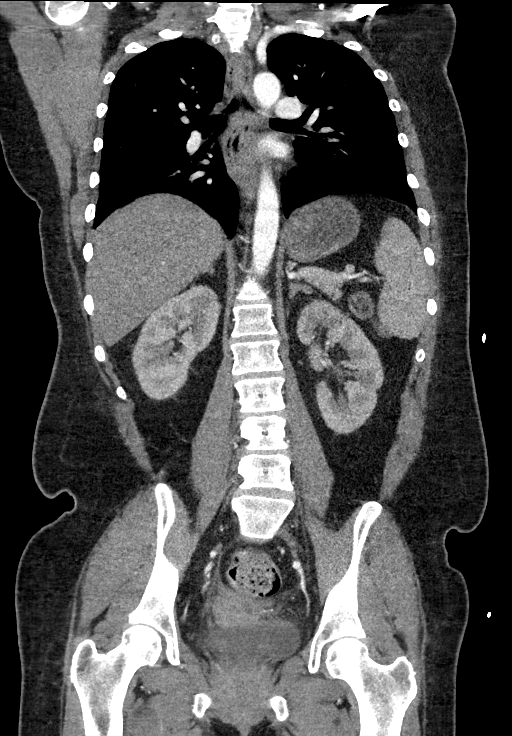

[16 of 36 positions shown; findings below may reference images not displayed]

IMPRESSION: 1.  No evidence of dissection. No acute aortic findings.                                  
 2.  Small hiatal hernia.

## 2021-08-17 IMAGING — CT CTA Abd For Dissection
2 of 6 series · 16 of 36 positions shown · IV contrast (omnipaque)
Comparison: none

CTA Abd For Dissection
INDICATION: Evaluate for Dissection.                                                     
 Pertinent History: back pain                                                              
 Surgical History: Cholecystectomy.  Tubal ligation.                                       
 Cancer: None                                                                              
 GFR (past 30 days): Not applicable                                                        
 Smoking status:                                                                           
 Intravenous contrast: 100 mL Omnipaque 350                                                
 Technologist Comments: None
TECHNIQUE: Computed tomographic angiography; empiric delay for contrast timing; 3D        
 angiographic MIP (maximum intensity projection) reconstructions; sagittal and coronal     
 reformats. Pre and post IV contrasted images.                                             
 Utilized dose reduction techniques include: Automated Exposure Control, vendor specific   
 iterative reconstruction technique                                                        
 CTA CHEST FINDINGS:                                                                       
 Normal heart size. Trace pericardial effusion. Negative for thoracic aortic aneurysm. No  
 evidence of acute intramural hematoma. Three-vessel aortic arch.                          
 Motion degraded exam from cardiac pulsation artifact with no evidence of dissection or    
 acute or suspicious findings of the aorta. No evidence of pulmonary embolism to the first 
 segmental pulmonary artery level. No appreciable coronary and minimal aortoiliac          
 atherosclerosis.                                                                          
 ?                                                                                         
 CTA ABDOMEN/PELVIS FINDINGS:                                                              
 Negative for abdominal aortic aneurysm for acute intramural hematoma. Negative for        
 dissection or acute/suspicious findings of the aorta. Celiac artery origin is patent.     
 Celiac artery supplies the common hepatic and left gastric arteries. Superior mesenteric  
 artery origin is patent. Superior mesenteric artery supplies the splenic artery. Superior 
 superior mesenteric artery and major branches are grossly patent. There are 2 right and a 
 one left renal arteries. Renal artery origins are patent. Renal arteries are grossly      
 patent with normal contour. Inferior mesenteric artery is patent. Bilateral common,       
 external, and internal iliac arteries are patent. No significant atherosclerosis.         
 Other findings:                                                                           
 Small hiatal hernia. Negative for pleural effusion. Negative for pneumothorax. Central    
 airways are patent. Mild subsegmental atelectasis. No focal consolidation. There are      
 calcified lung nodules reflective of prior granulomatous disease. Negative for thoracic   
 adenopathy. Surgically absent gallbladder and appendix. Moderate volume stool in the      
 colon. No acute or suspicious findings of the abdomen or pelvis. Overall mild             
 degenerative changes of the spine.                                                        
 Key: (S/I) = series number / image number                                                 
 ____________

[Series 7: ax post c/a/p · axial · 0.77mm/px · z∈[-552,-16]mm · 13 of 417 slices shown]
[im 30/417  lung]
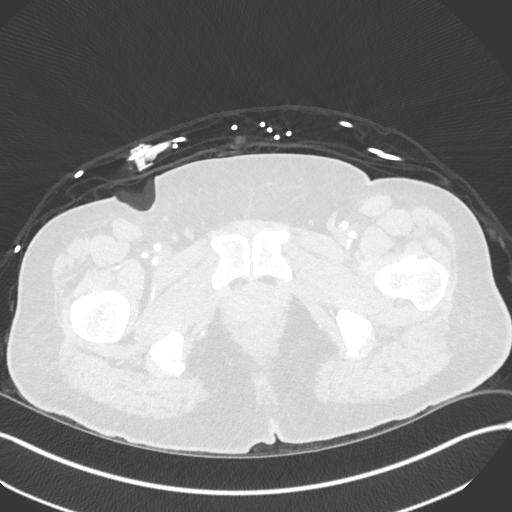
[im 60/417  mediastinal]
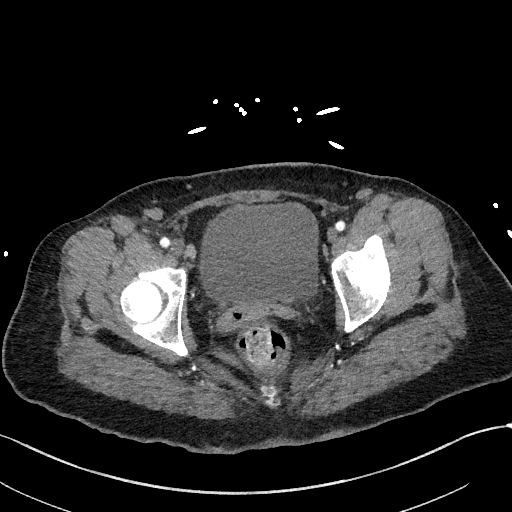
[im 90/417  lung]
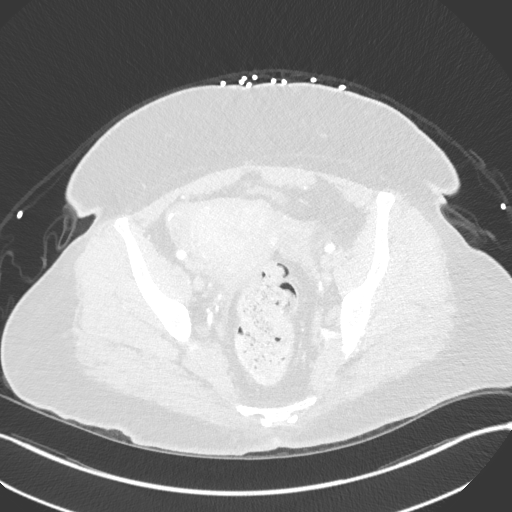
[im 119/417  mediastinal]
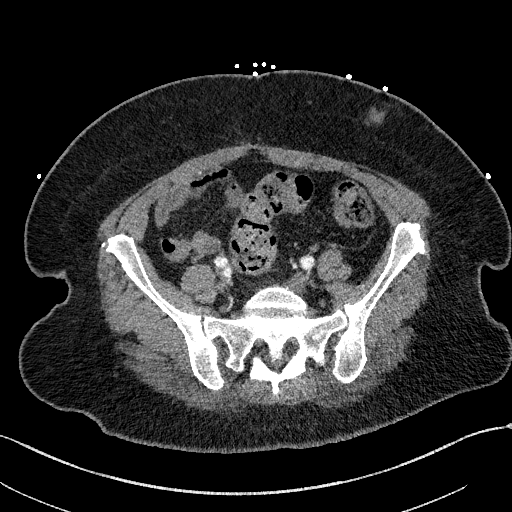
[im 149/417  lung]
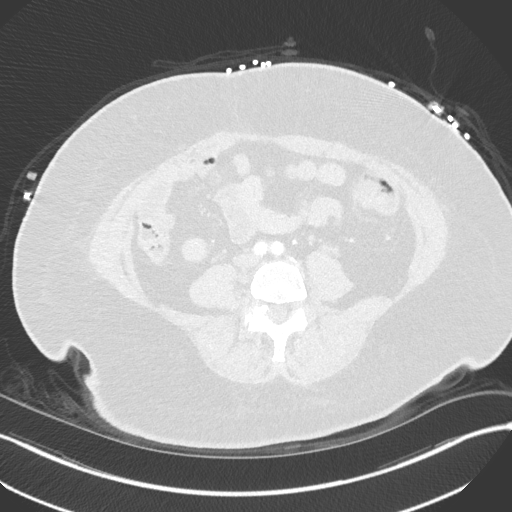
[im 179/417  mediastinal]
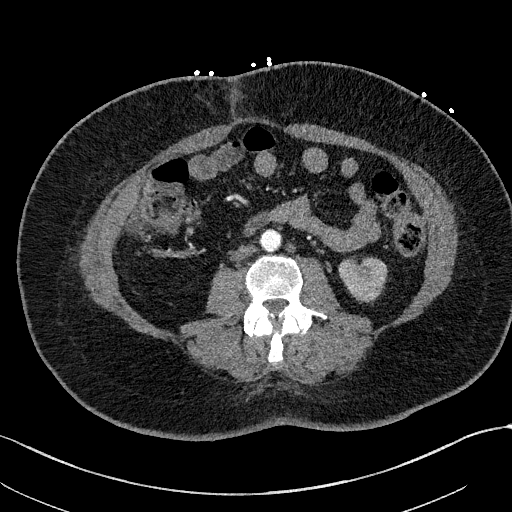
[im 209/417  lung]
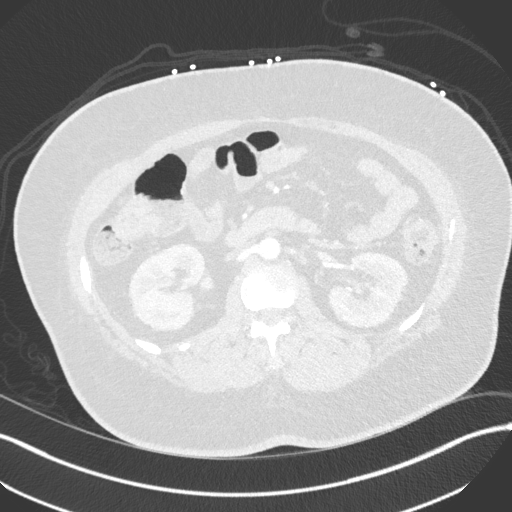
[im 238/417  mediastinal]
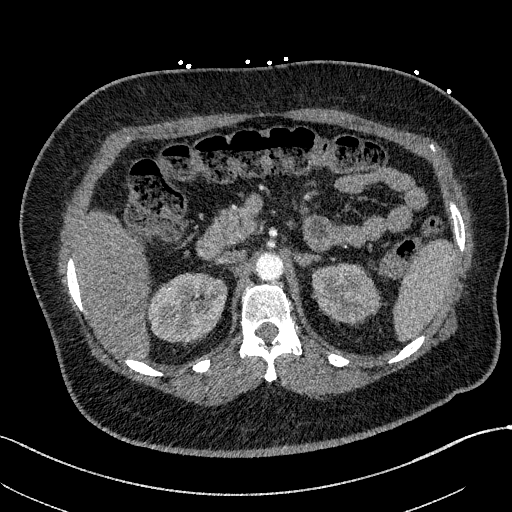
[im 268/417  lung]
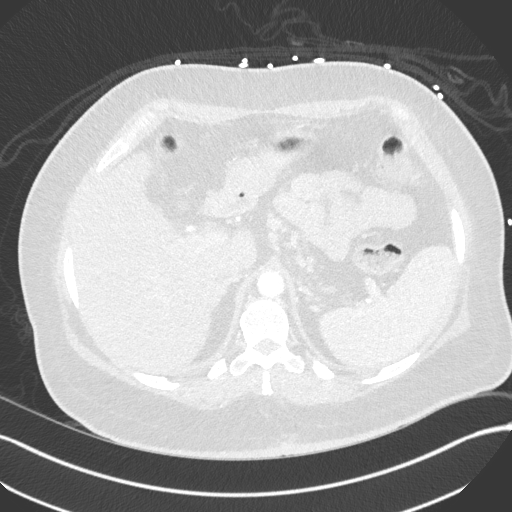
[im 298/417  mediastinal]
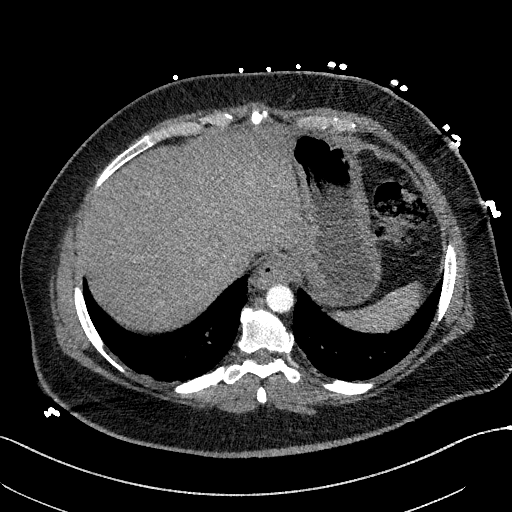
[im 327/417  lung]
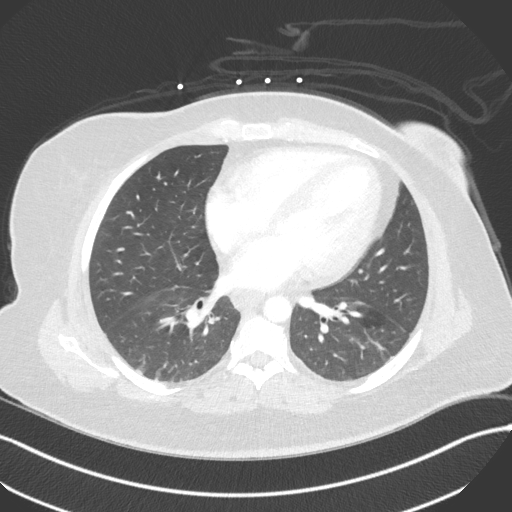
[im 357/417  mediastinal]
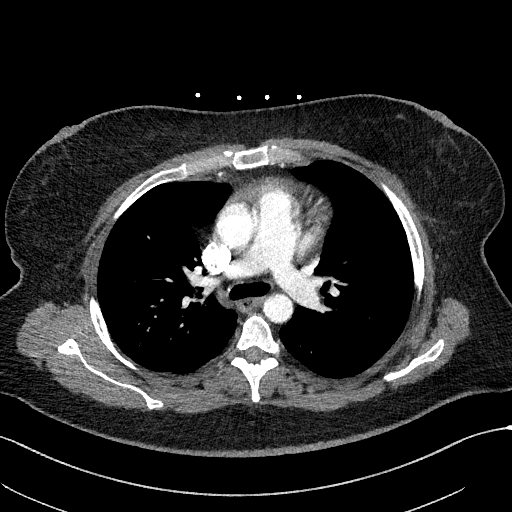
[im 387/417  lung]
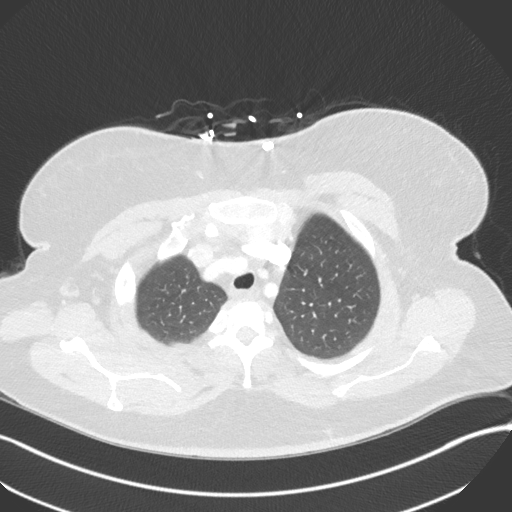

[Series 8: cor 2x2 · coronal · 0.85mm/px · 3 of 156 slices shown]
[im 32/156  mediastinal]
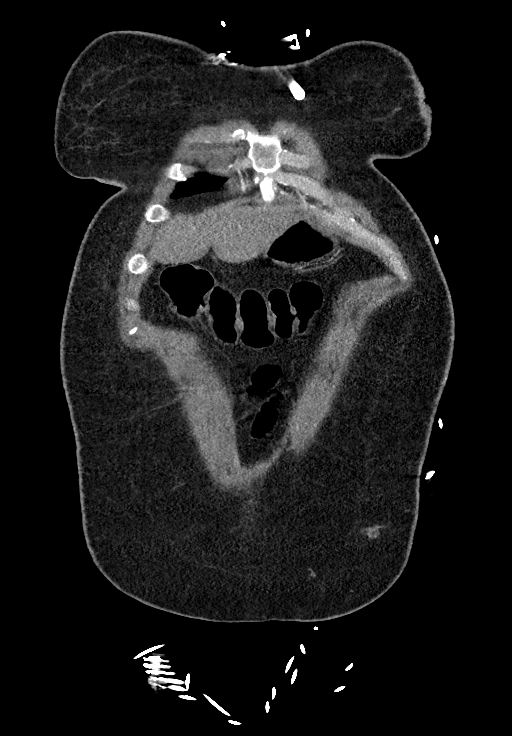
[im 63/156  mediastinal]
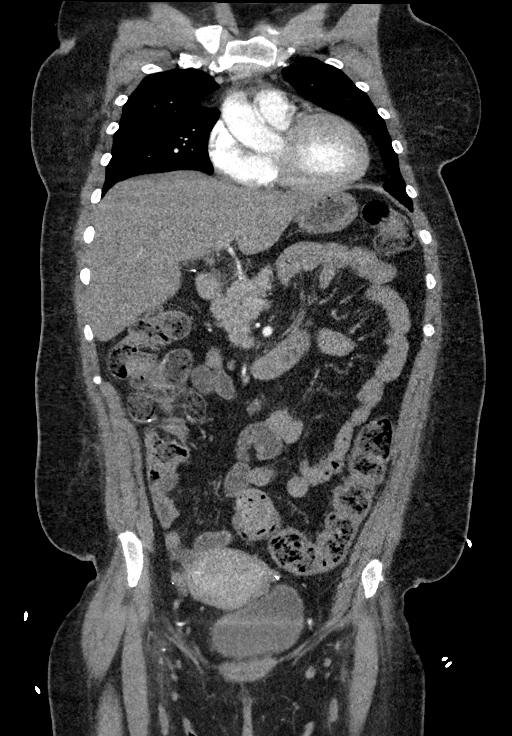
[im 94/156  mediastinal]
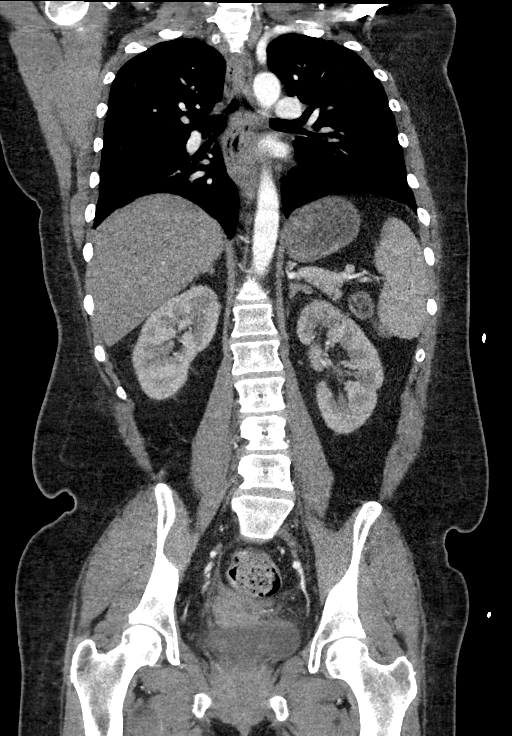

[16 of 36 positions shown; findings below may reference images not displayed]

IMPRESSION: 1.  No evidence of dissection. No acute aortic findings.                                  
 2.  Small hiatal hernia.

## 2021-08-25 IMAGING — CT CT Brain W-O Contrast
3 of 5 series · 14 of 47 positions shown, 16 images · non-contrast
Comparison: CT head 08/13/2021;

CT HEAD WITHOUT CONTRAST                                                                  
 _______________________________________
CLINICAL INFORMATION: Altered mental status. History of CVA.
TECHNIQUE: Helical acquisition with coronal and sagittal reformats.                                  
 Utilized dose optimization technique(s): automated exposure control.                      
 LIMITATIONS: None.                                                                        

[Series 4: cor pre · coronal · non-contrast · 0.31mm/px · 3 of 65 slices shown]
[im 22/65  brain]
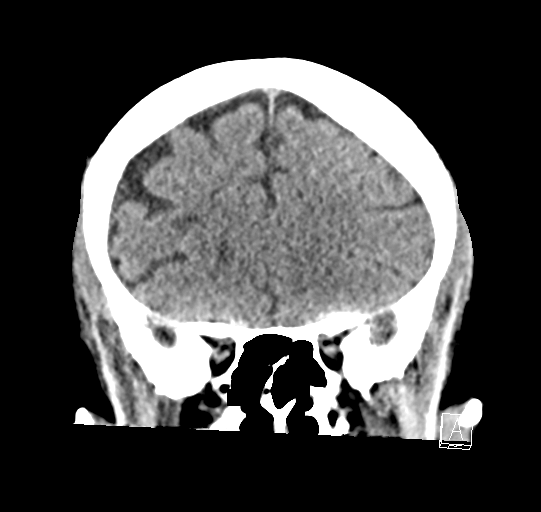
[im 29/65  brain]
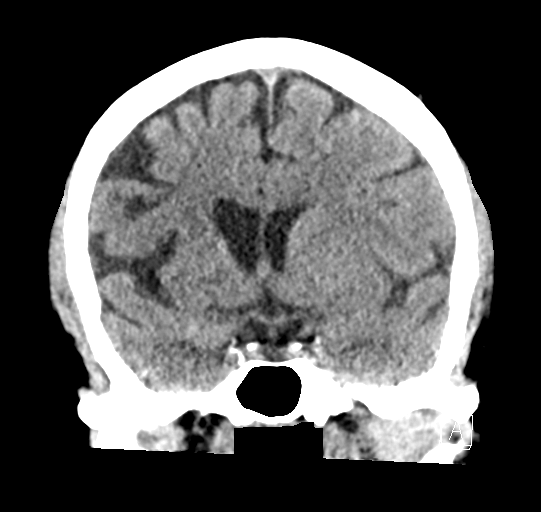
[im 36/65  brain]
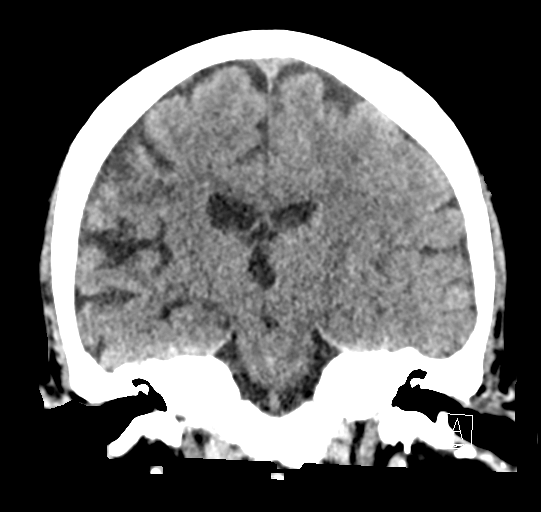

[Series 5: sag pre · sagittal · non-contrast · 0.31mm/px · 3 of 57 slices shown]
[im 19/57  brain]
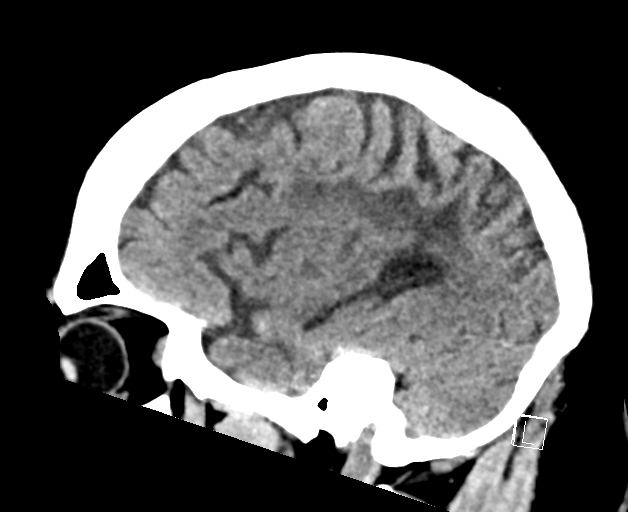
[im 29/57  brain]
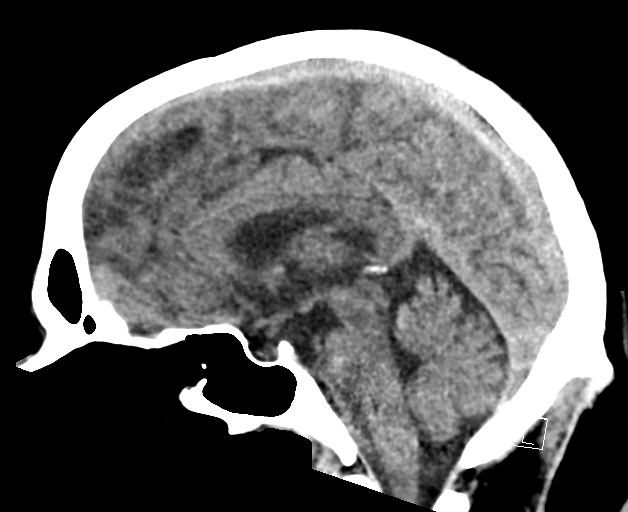
[im 38/57  brain]
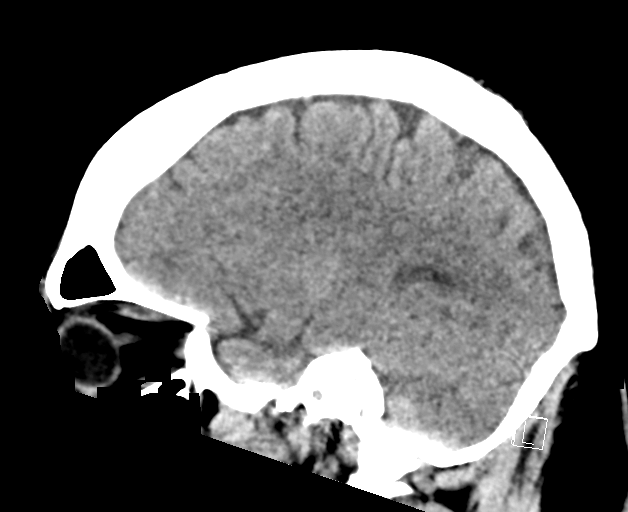

[Series 6: corr axial · axial · 0.33mm/px · z∈[+128,+250]mm · 8 of 54 slices shown, 10 images]
[im 6/54  brain]
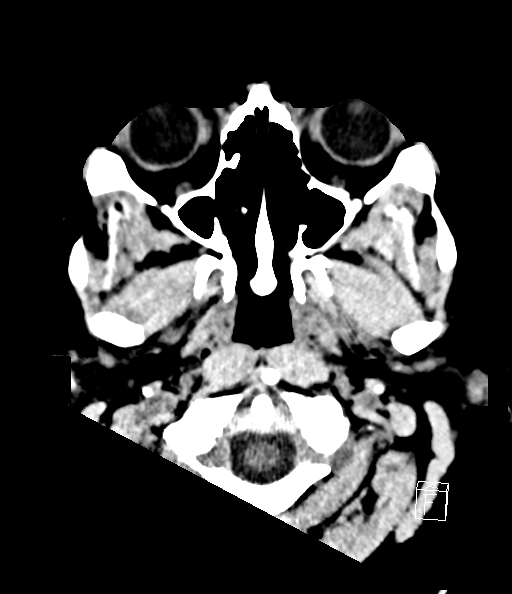
[im 6/54  bone]
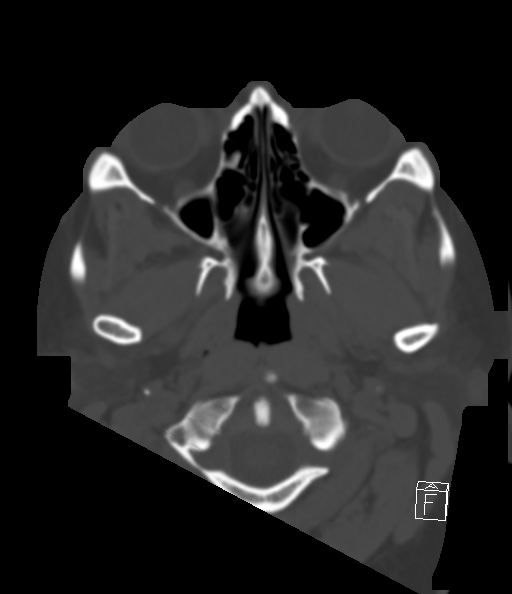
[im 11/54  brain]
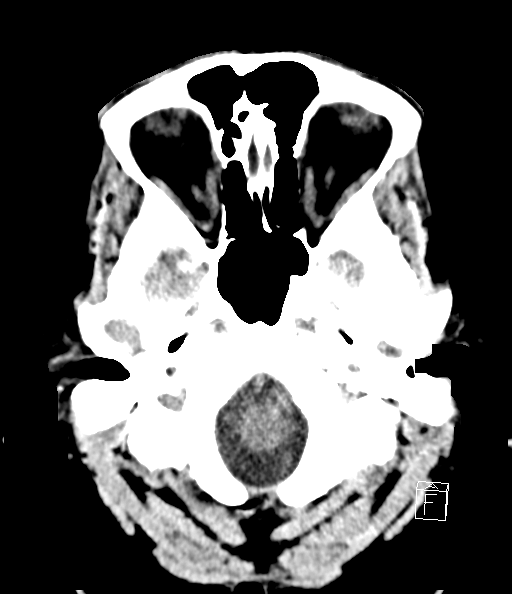
[im 16/54  brain]
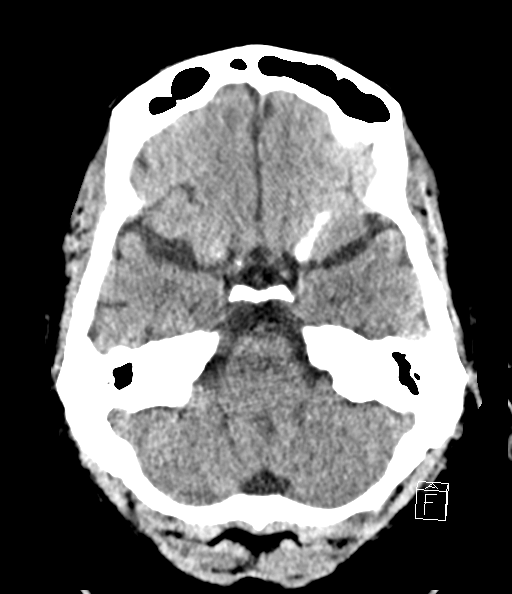
[im 22/54  brain]
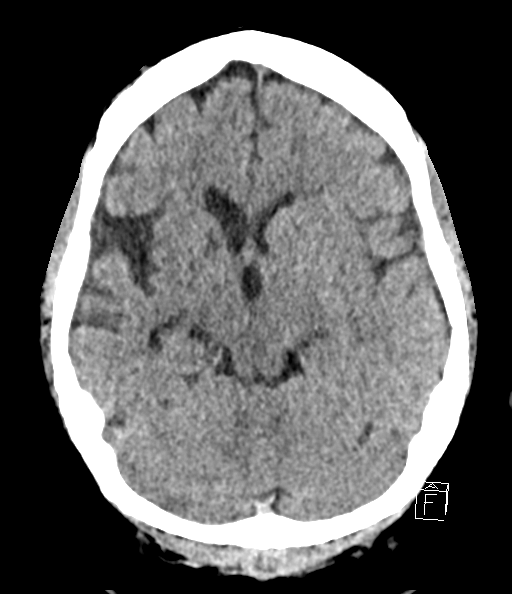
[im 32/54  brain]
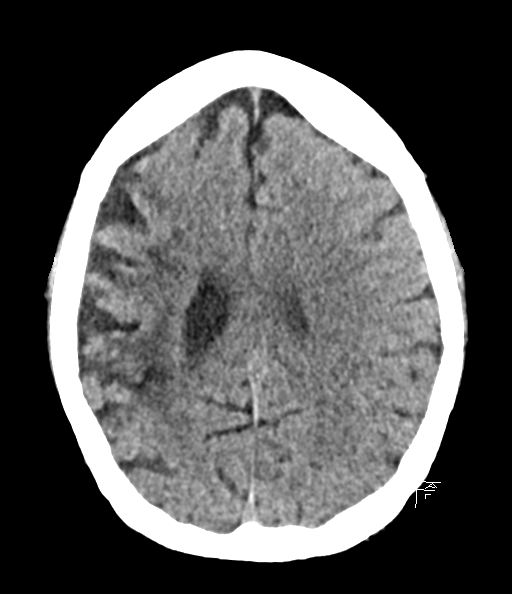
[im 32/54  bone]
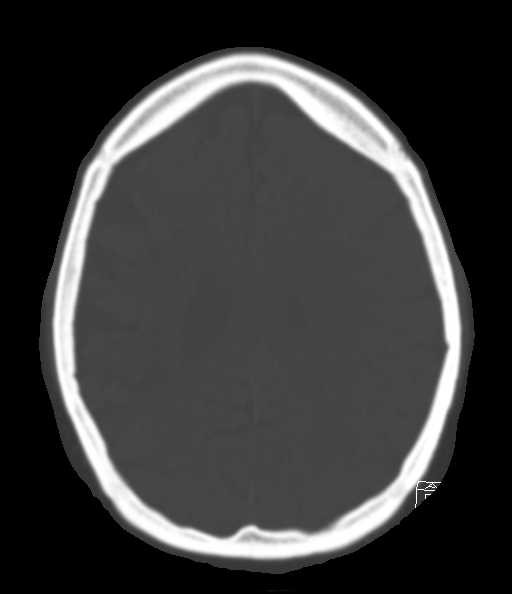
[im 38/54  brain]
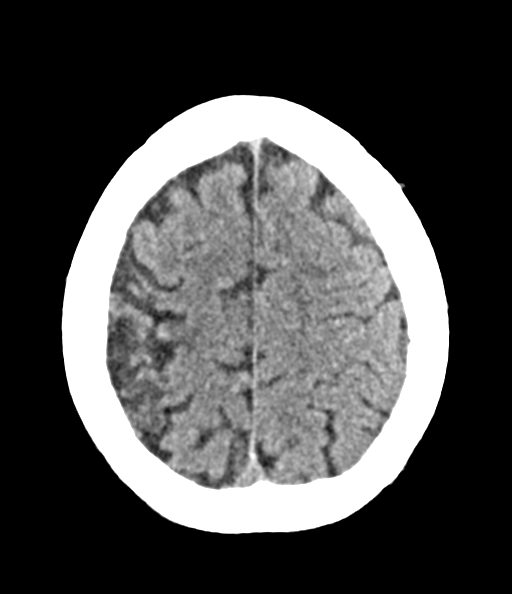
[im 43/54  brain]
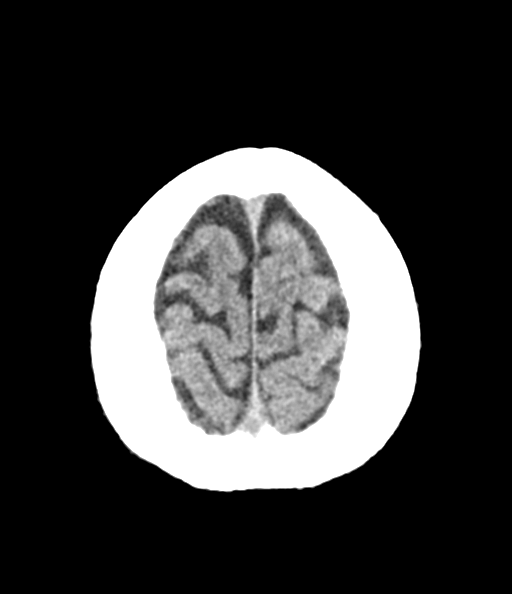
[im 48/54  brain]
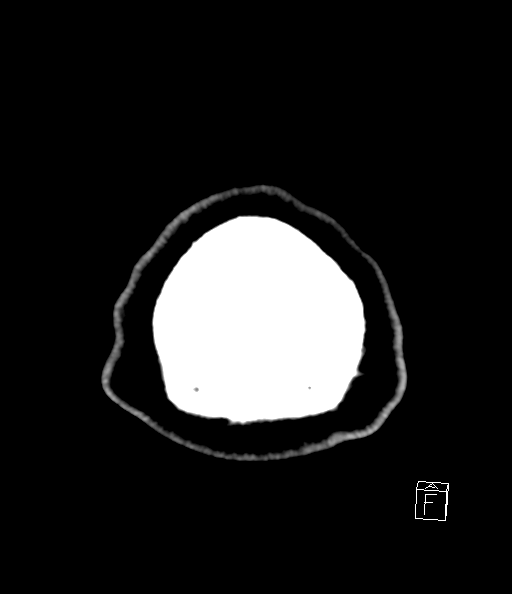

[14 of 47 positions shown; findings below may reference images not displayed]

brain MRI                                                              
 05/27/2021                                                                                 
 _______________________________________
IMPRESSION: 1.  No acute finding.                                                                     
 2.  Chronic right MCA infarct (cortical, subcortical and caudate nuclei).                 
 3.  No significant change in ventricular system (chronic asymmetric mild enlarged right   
 lateral ventricle).                                                                       
 4.  No posterior fossa or calvarium abnormality.                                          
 5.  No significant abnormality visualized orbits, paranasal sinuses or temporal bone      
 structures.

## 2021-08-25 IMAGING — CT CT Abdomen and Pelvis W-Contrast
3 series · 7 of 16 positions shown, 14 images · IV contrast (omnipaque)
Comparison: December 23, 2020

CT Abdomen and Pelvis W-Contrast
INDICATION: Abdominal Pain.                                                              
 Pertinent History:  Blurry vision, Bilateral flank pain, Elevated BS; CVA, Eliquis,       
 Noncompliant diabetic                                                                     
 Surgical History: Tubal Ligation.                                                         
 Cancer: None                                                                              
 GFR (past 30 days): 30-39 ml/min                                                          
 Lipase: N/A       Amylase: N/A      WBC:
 Intravenous contrast: 100 mL Omnipaque 350                                                
 Oral contrast: No                                                                         
 Technologist Comments: Negative pregnancy test
TECHNIQUE: Routine with IV contrast. Helical acquisition with sagittal and coronal        
 reformations; post IV contrast images.                                                    
 Utilized dose reduction techniques include: Automated Exposure Control, vendor specific   
 iterative reconstruction technique

[Series 3: ax post · axial · 0.90mm/px · z∈[-507,-207]mm · 4 of 100 slices shown, 9 images]
[im 20/100  soft-tissue]
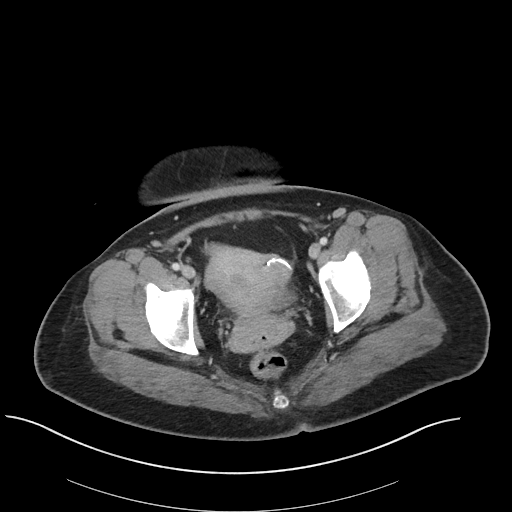
[im 20/100  lung]
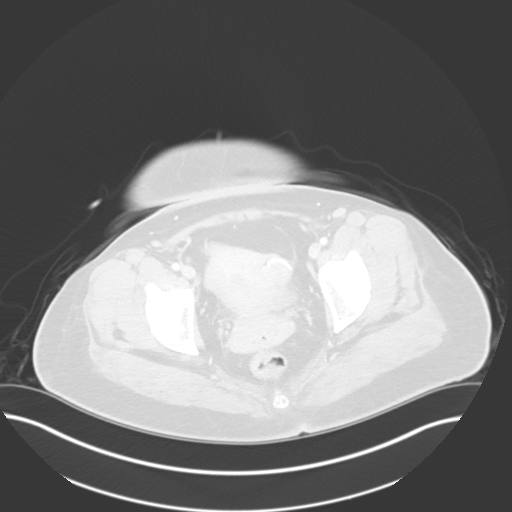
[im 20/100  bone]
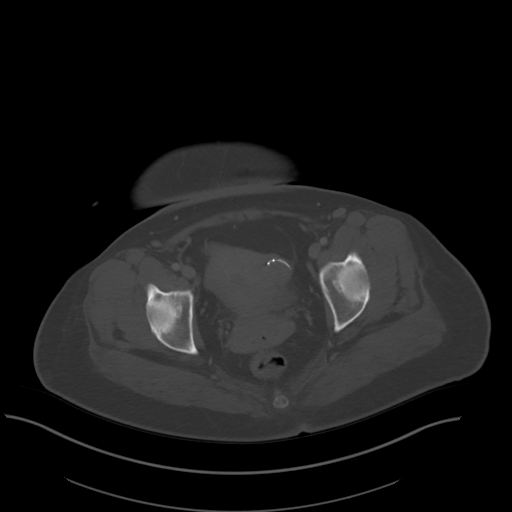
[im 40/100  soft-tissue]
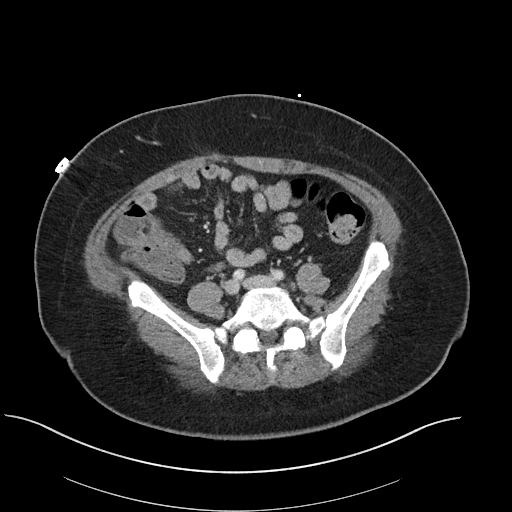
[im 40/100  lung]
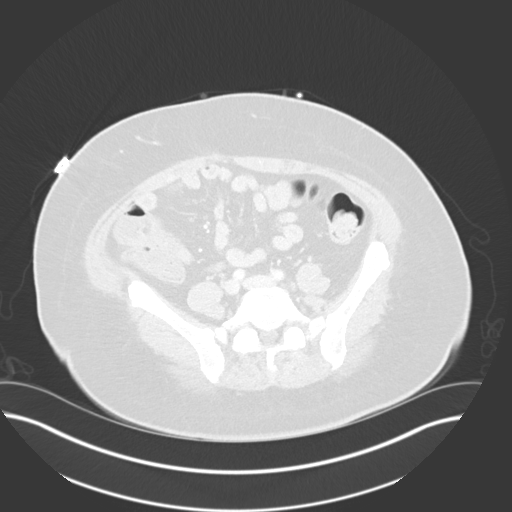
[im 60/100  soft-tissue]
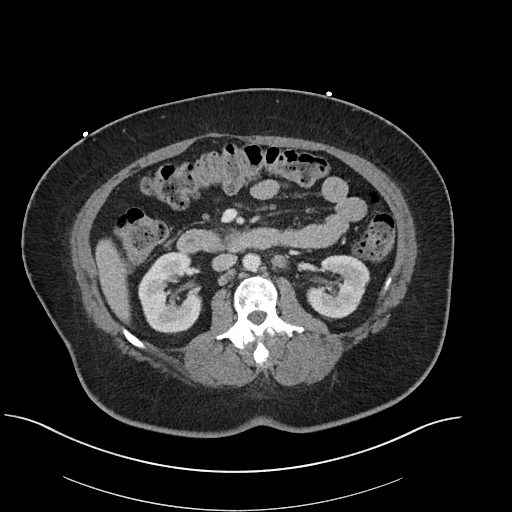
[im 60/100  lung]
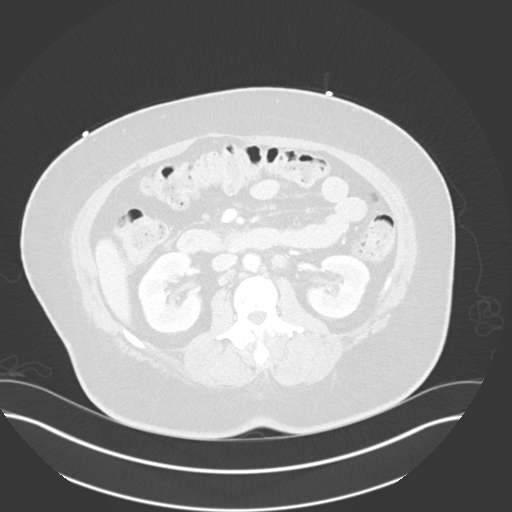
[im 80/100  soft-tissue]
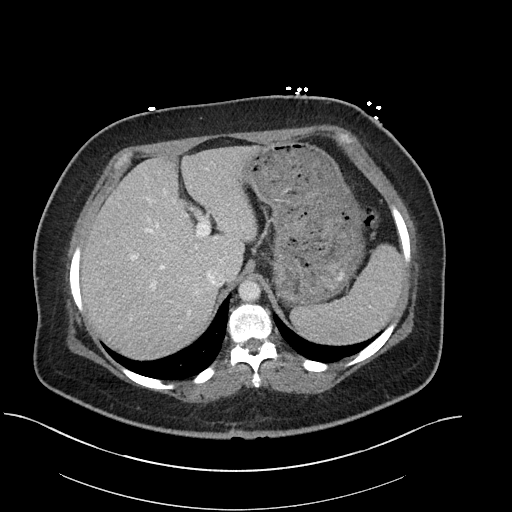
[im 80/100  lung]
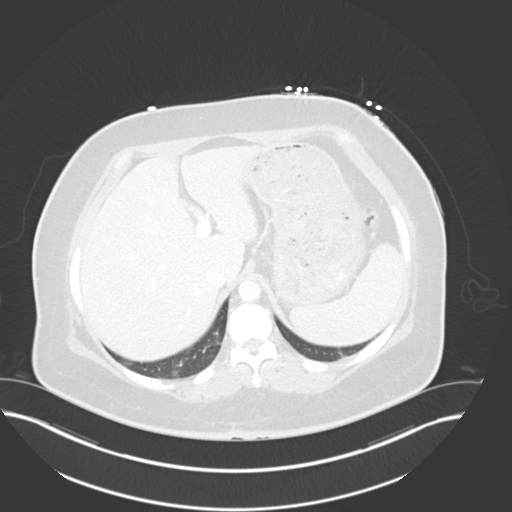

[Series 4: cor post · coronal · 0.88mm/px · 2 of 64 slices shown, 3 images]
[im 22/64  soft-tissue]
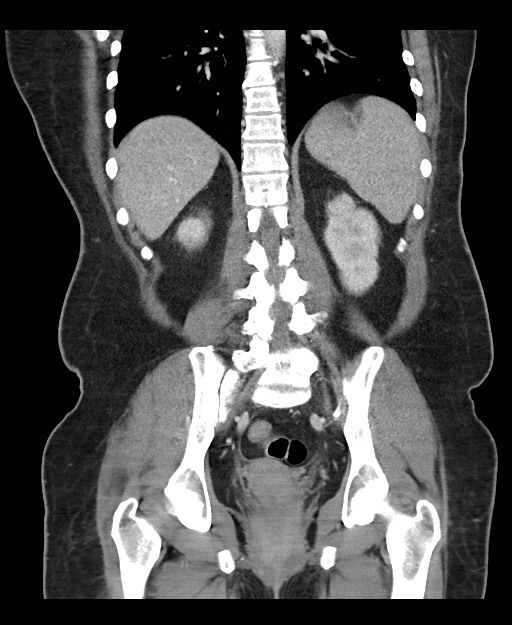
[im 22/64  bone]
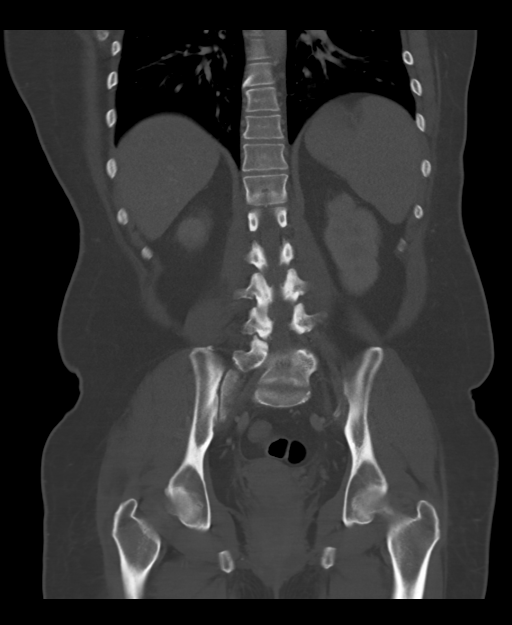
[im 43/64  soft-tissue]
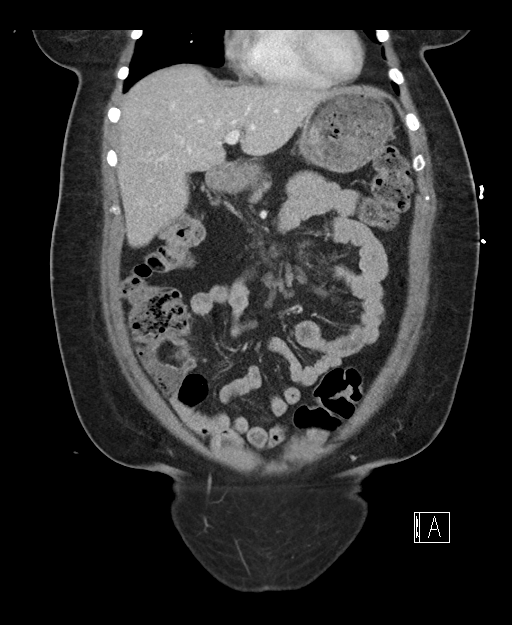

[Series 5: sag post · sagittal · 0.62mm/px · 1 of 91 slices shown, 2 images]
[im 37/91  soft-tissue]
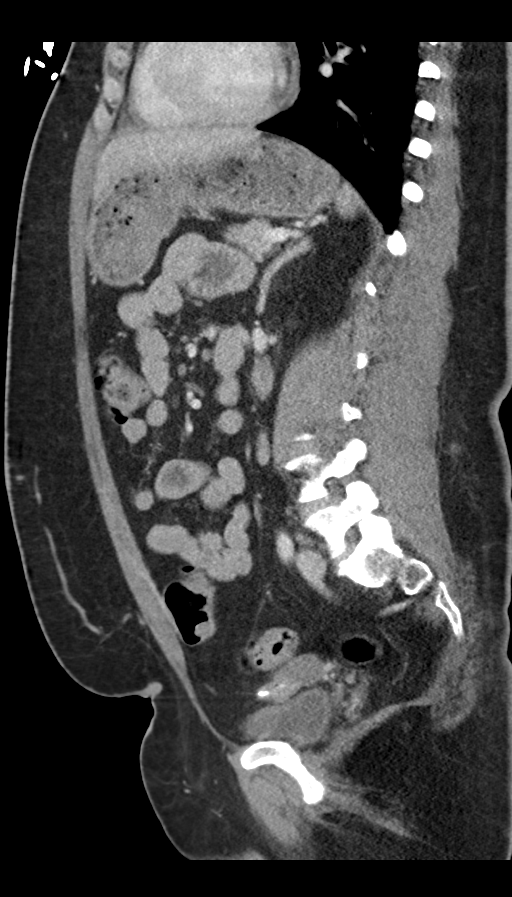
[im 37/91  bone]
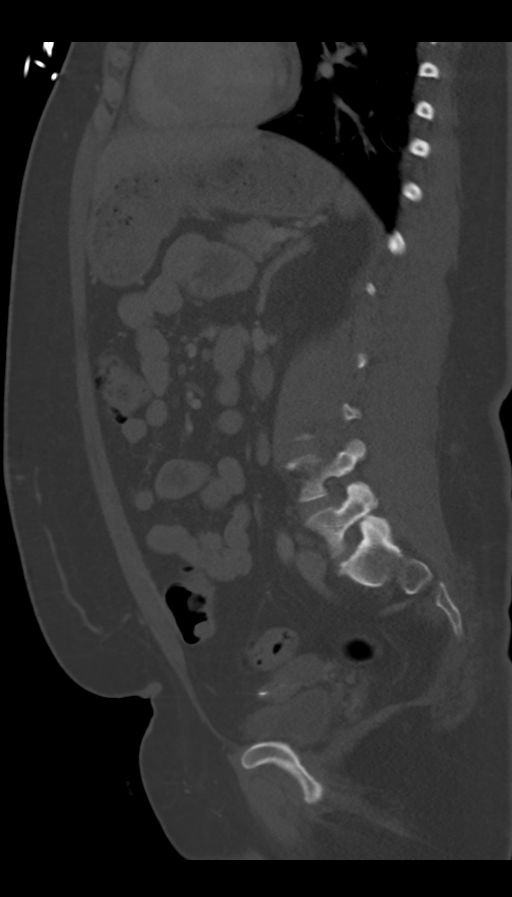

[7 of 16 positions shown; findings below may reference images not displayed]

FINDINGS: LOWER CHEST:                                                                              
 Right lower lobe lateral peripheral 0.5 cm lung nodule ([DATE]), stable.                     
 Mild subsegmental atelectasis of the lung bases. Normal heart size. Trace pericardial     
 effusion.                                                                                 
 HEPATOBILIARY:                                                                            
 Unremarkable liver.                                                                       
 Post cholecystectomy.                                                                     
 Negative for biliary ductal dilatation.                                                   
 SPLEEN:                                                                                   
 Normal spleen size with no focal lesion.                                                  
 PANCREAS:                                                                                 
 Normal appearance of pancreas with homogeneous enhancement.                               
 GENITOURINARY:                                                                            
 Normal adrenal glands with no evidence of adrenal lesion.                                 
 Normal enhancement of the kidneys with no evidence of                                     
 hydronephrosis.                                                                           
 LYMPHOVASCULAR:                                                                           
 Negative for abdominal aortic aneurysm.                                                   
 Normal opacification of the hepatic, portal, splenic veins, renal veins, and              
 proximal SMV.                                                                             
 Left para-aortic 1.2 cm adenopathy, stable.                                               
 GI TRACT:                                                                                 
 Negative for bowel obstruction. Small to moderate volume stool in the colon. No           
 evidence of acute appendicitis with surgical clips about the cecum consistent with        
 appendectomy.                                                                             
 PELVIC UROGENITAL STRUCTURES:                                                             
 Unremarkable urinary bladder. Uterus is present.                                          
 PERITONEUM/BODY WALL:                                                                     
 Negative for free fluid or free air.                                                      
 Visualized bones are unremarkable. There is minimal spondylosis.                          
 Key: (S/I) = series number / image number                                                 
 ____________
IMPRESSION: 1.  No acute findings of the abdomen or pelvis.                                           
 2.  Stable left para-aortic 1.2 cm lymph node, nonspecific.                               
 3.  Stable right 0.5 cm lung nodule.

## 2021-09-26 IMAGING — CR DX Chest X-Ray Portable
1 series · 1 of 1 positions shown · non-contrast
Comparison: September 23, 2021

PROCEDURE:  DX Chest X-Ray Portable.  09/26/2021 [DATE].
TECHNIQUE: Chest radiograph single view (AP or PA).
HISTORY: Chest Pain

[chest ap vg]
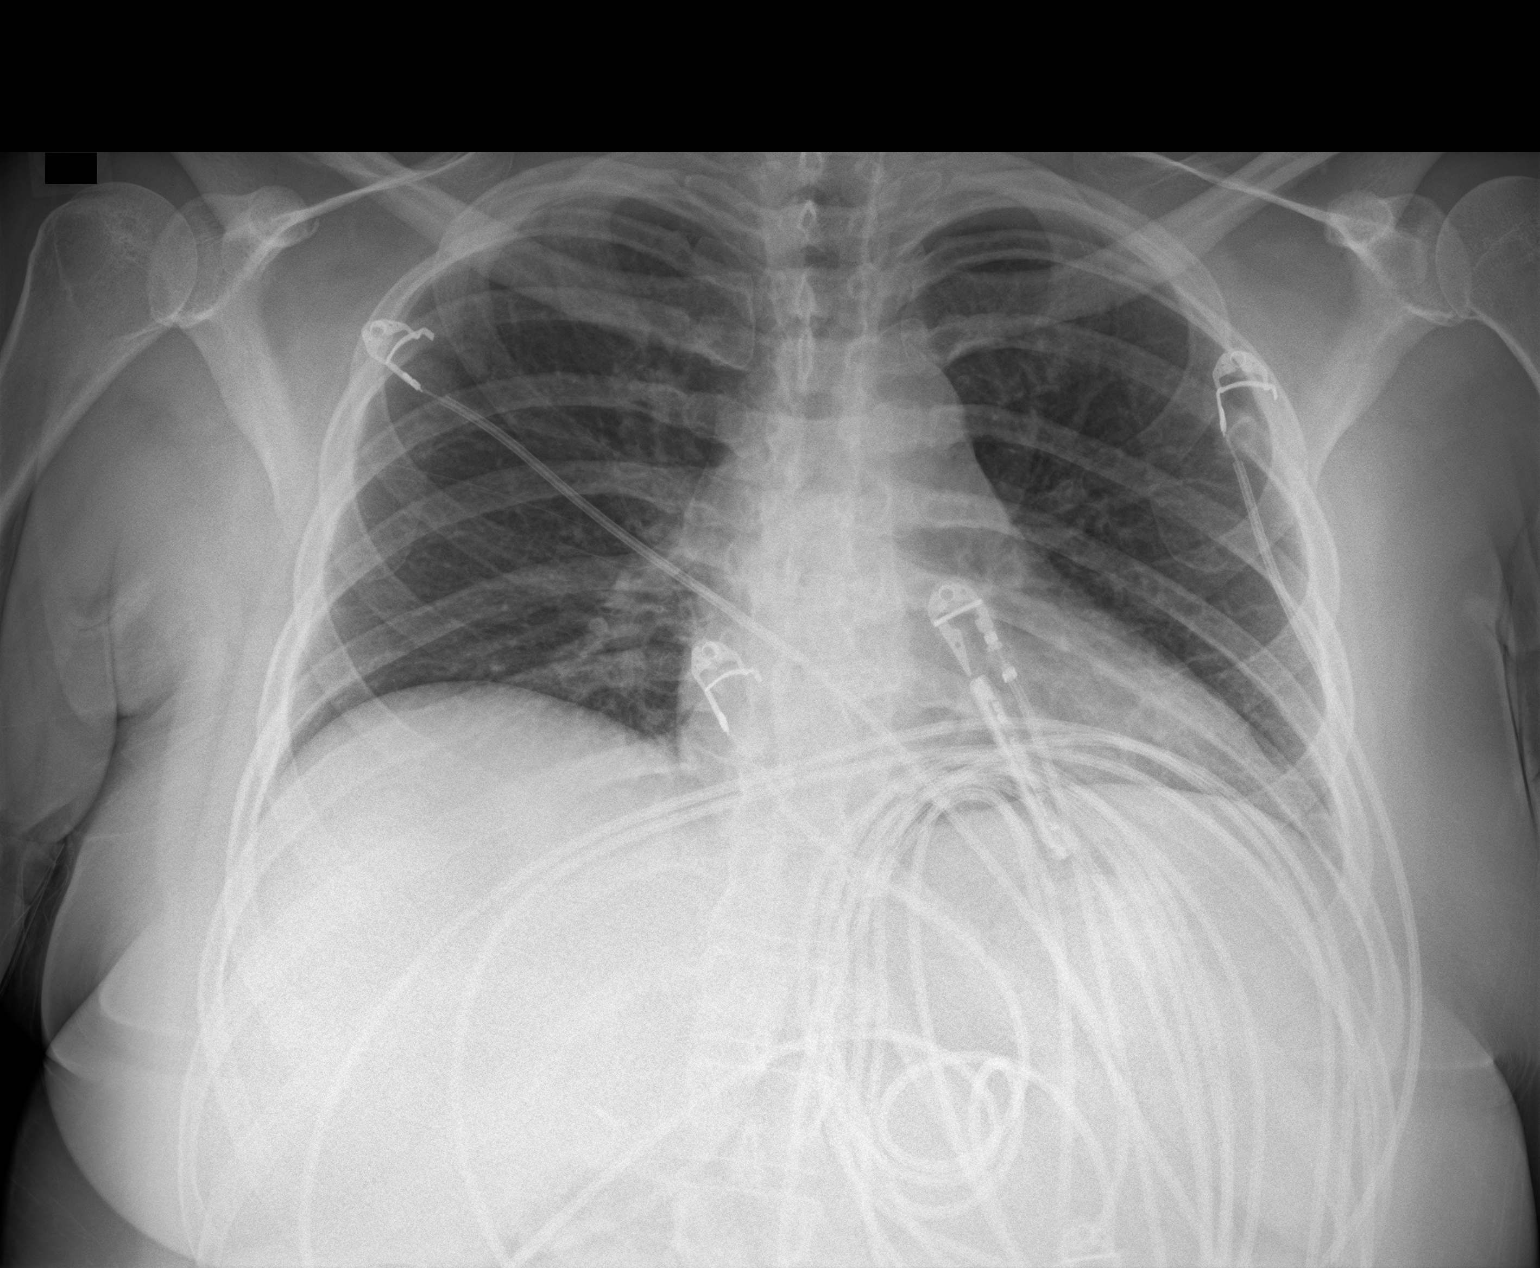

[1 of 1 positions shown; findings below may reference images not displayed]

FINDINGS: Stable cardiomediastinal contours. Stable thoracic musculoskeletal structures and upper   
 abdomen.                                                                                  
 Low lung volumes with crowded lung markings. No superimposed focal consolidation.         
 No appreciable pleural effusion or pneumothorax.                                          
 ____________
IMPRESSION: No acute cardiopulmonary findings.

## 2021-09-26 IMAGING — CT CTA Chest-PE
2 of 4 series · 14 of 36 positions shown · IV contrast (omnipaque)
Comparison: CTA thorax 08/17/2021                                                                      
 _______________________________________

CTA THORAX WITH CONTRAST                                                                  
 _______________________________________
CLINICAL INFORMATION: Chest pain, shortness of breath. High pretest probability.                                
 Smoking status: Current.
TECHNIQUE: Helical acquisition with timing for assessment of the pulmonary arteries.                 
 Sagittal and coronal reformats.                                                           
 3D maximum intensity projection (MIP) coronal reformats.                                  
 Utilized dose optimization technique(s): automated exposure control, iterative            
 reconstruction.                                                                           
 Intravenous contrast: 70 ml Omnipaque 350                                                 
 IMAGE QUALITY:                                                                            
 Adequacy of Pulmonary Arterial Enhancement: Optimal-more than enough enhancement for      
 diagnosis.                                                                                
 Adequacy of Breath Hold: Optimal-no respiratory motion artifact.                          
 Other Artifacts Affecting Scan Quality: None.                                             
 Overall Exam Quality:  Excellent-optimal for                                              

[Series 2: ax post chest · axial · 0.68mm/px · z∈[+956,+1177]mm · 11 of 248 slices shown]
[im 14/248  lung]
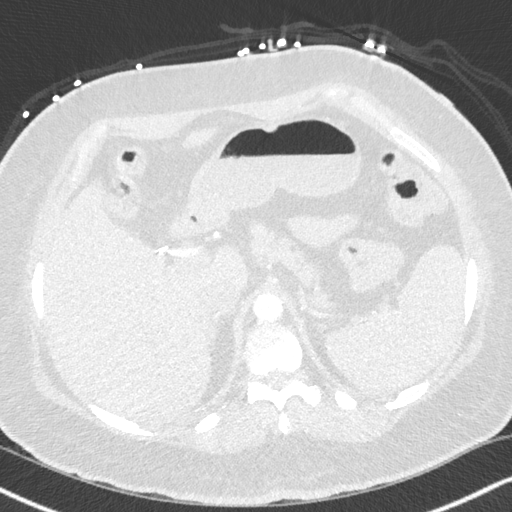
[im 40/248  mediastinal]
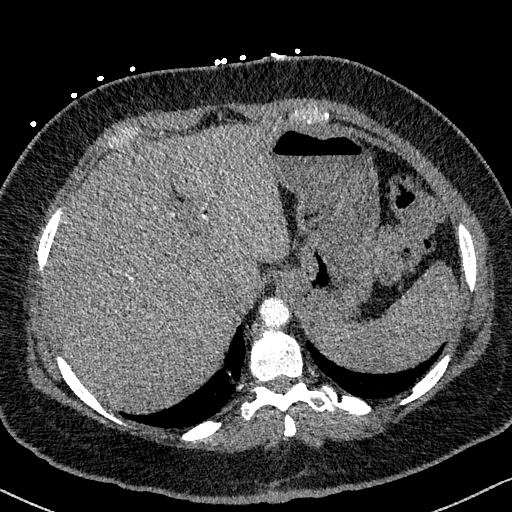
[im 66/248  lung]
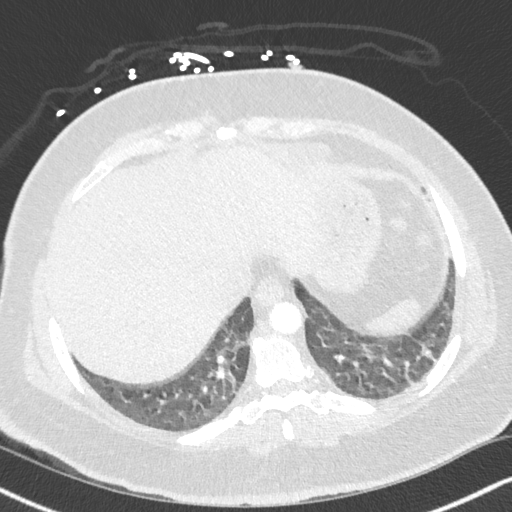
[im 79/248  mediastinal]
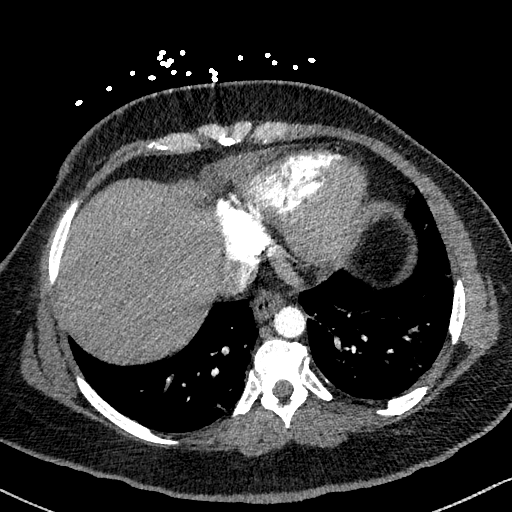
[im 105/248  lung]
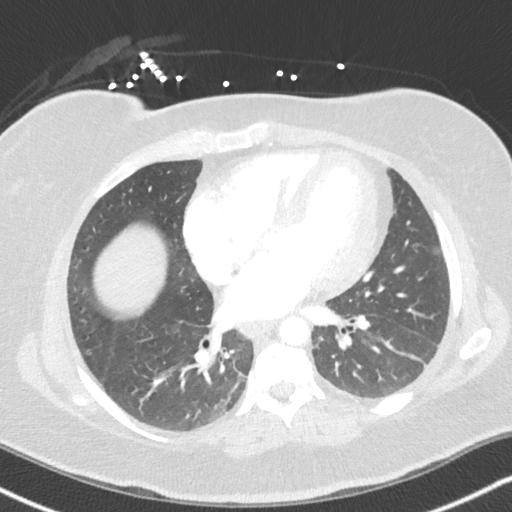
[im 131/248  mediastinal]
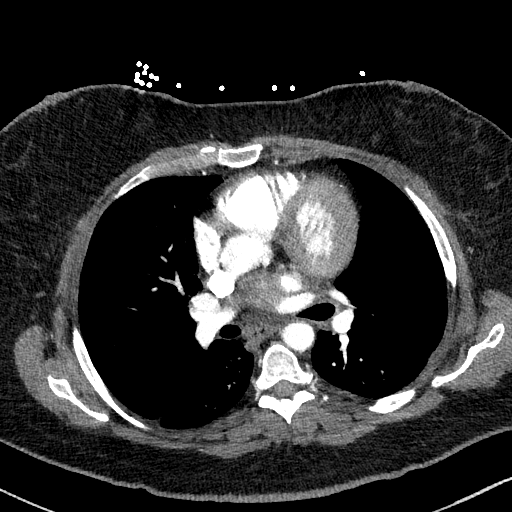
[im 144/248  lung]
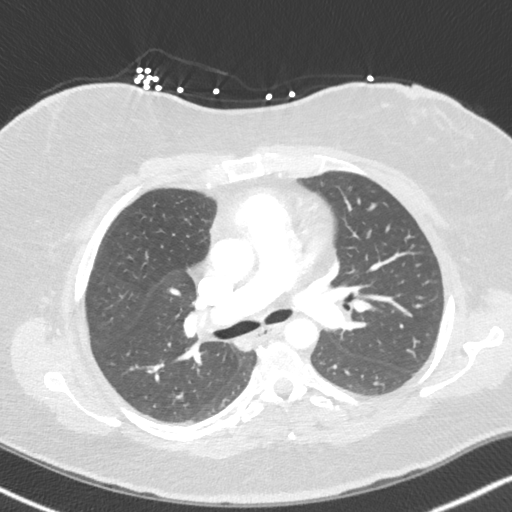
[im 170/248  mediastinal]
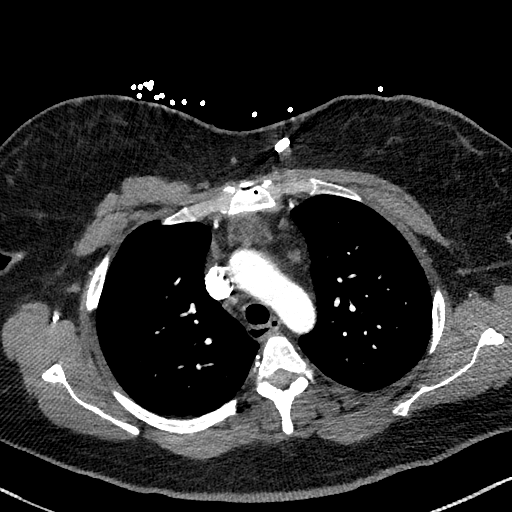
[im 183/248  lung]
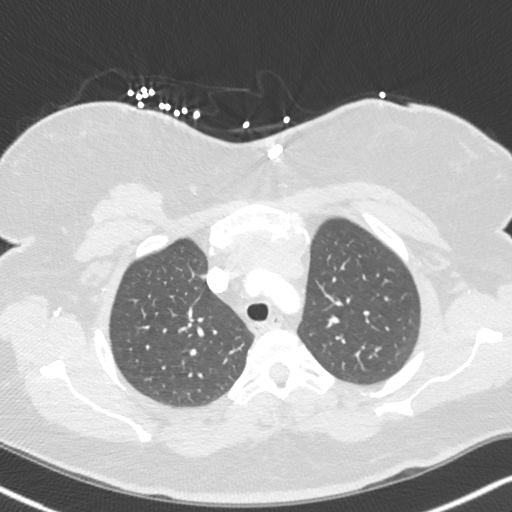
[im 209/248  mediastinal]
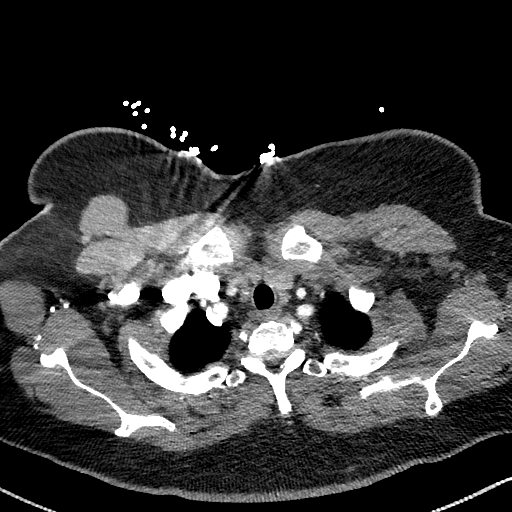
[im 235/248  lung]
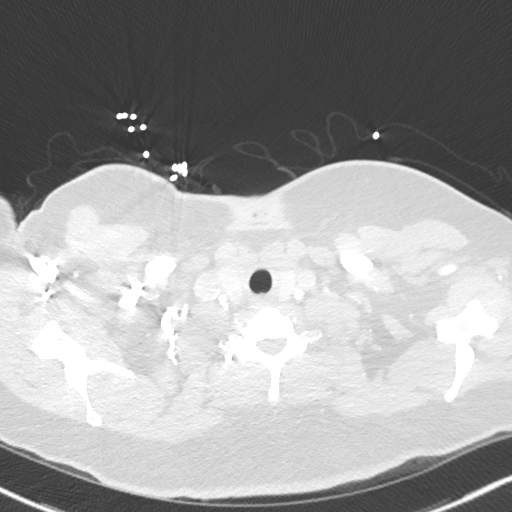

[Series 4: cor post chest · coronal · 0.49mm/px · 3 of 309 slices shown]
[im 62/309  mediastinal]
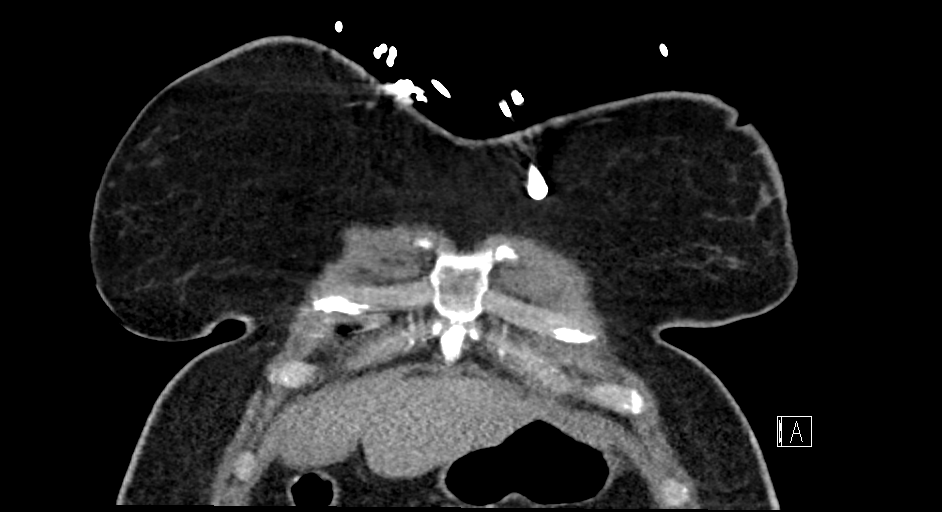
[im 124/309  mediastinal]
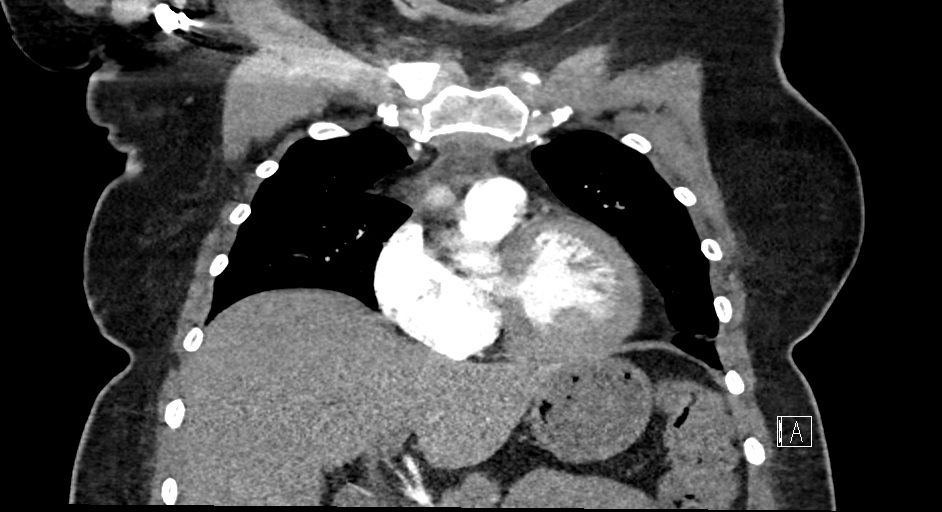
[im 185/309  mediastinal]
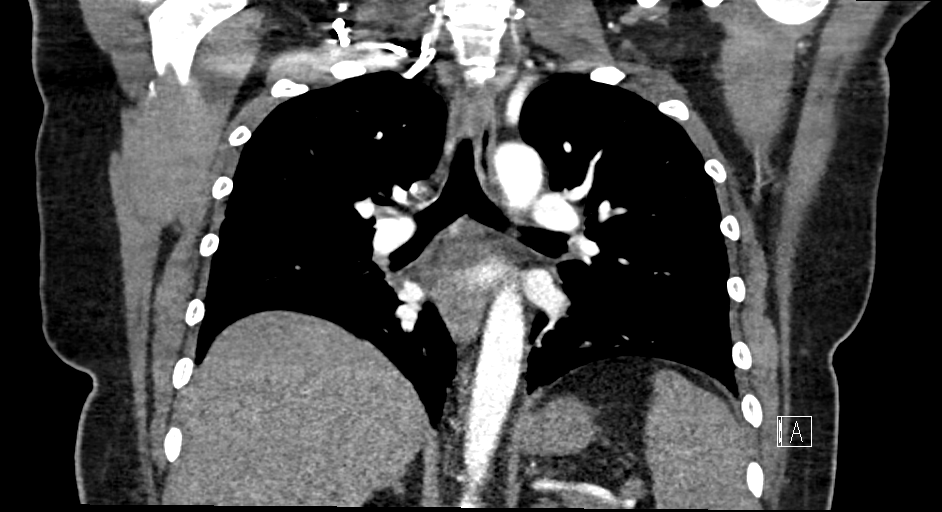

[14 of 36 positions shown; findings below may reference images not displayed]

FINDINGS: PULMONARY ARTERIES:                                                                       
 Filling defect(s): None                                                                   
 Non-evaluable pulmonary segments: None.                                                   
 Main pulmonary artery diameter: Within normal limits.                                     
 LOWER NECK:                                                                               
 No actionable nodule is present in the imaged portion of the thyroid                      
 lobes.                                                                                    
 HEART/PERICARDIUM:                                                                        
 Coronary artery calcifications (absent).                                                  
 The cardiac chambers are normal in size.                                                  
 No pericardial abnormality.                                                               
 THORACIC VESSELS:                                                                         
 Aorta (no abnormality).                                                                   
 No additional vascular abnormality.                                                       
 MEDIASTINUM:                                                                              
 No mediastinal mass or enlarged lymph nodes.                                              
 PULMONARY PARENCHYMA/AIRWAYS:                                                             
 Subsegmental groundglass bilateral lower lobe airspace opacities with coarse              
 reticular opacities. No pulmonary nodule suspicious for malignancy. No airway             
 abnormality.                                                                              
 PLEURAL SPACE:                                                                            
 No abnormality.                                                                           
 CHEST WALL/BONES:                                                                         
 No significant bone or soft-tissue abnormality.                                           
 UPPER ABDOMEN:                                                                            
 Gallbladder fossa metallic                                                                
 clips.                                                                                    
 _______________________________________
IMPRESSION: 1.  NO acute or chronic pulmonary embolism.                                               
 2.  Subsegmental bilateral inferior thoracic atelectasis                                  
 This study was preliminarily reported by [HOSPITAL].

## 2021-12-26 LAB — GBS, EXTERNAL RESULT: GBS, External Result: NEGATIVE

## 2022-01-22 ENCOUNTER — Inpatient Hospital Stay
Admit: 2022-01-22 | Discharge: 2022-01-24 | Disposition: A | Discharge status: Home / Self Care | Payer: PRIVATE HEALTH INSURANCE | Admitting: Obstetrics

## 2022-01-22 DIAGNOSIS — Z3A40 40 weeks gestation of pregnancy: Secondary | ICD-10-CM

## 2022-01-22 DIAGNOSIS — O480 Post-term pregnancy: Principal | ICD-10-CM

## 2022-01-22 LAB — URIC ACID: Uric Acid, Serum: 4.8 mg/dl (ref 3.1–7.8)

## 2022-01-22 LAB — COMPREHENSIVE METABOLIC PANEL
ALT: 20 U/L (ref 10–49)
AST: 17 U/L (ref 0.0–33.9)
Albumin: 3.5 gm/dl (ref 3.4–5.0)
Alkaline Phosphatase: 202 U/L — ABNORMAL HIGH (ref 46–116)
Anion Gap: 9 mmol/L (ref 5–15)
BUN: <5 mg/dl — ABNORMAL LOW (ref 9–23)
CO2: 20 mEq/L (ref 20–31)
Calcium: 9.7 mg/dl (ref 8.7–10.4)
Chloride: 107 mEq/L (ref 98–107)
Creatinine: 0.71 mg/dl (ref 0.55–1.02)
GFR African American: >60
GFR Non-African American: >60
Glucose: 82 mg/dl (ref 74–106)
Potassium: 5.1 mEq/L (ref 3.5–5.1)
Sodium: 136 mEq/L (ref 136–145)
Total Bilirubin: 0.5 mg/dl (ref 0.30–1.20)
Total Protein: 7.1 gm/dl (ref 5.7–8.2)

## 2022-01-22 LAB — ANTIBODY SCREEN: Antibody Screen: NEGATIVE

## 2022-01-22 LAB — ABO/RH: ABO/Rh: O POS

## 2022-01-22 LAB — CBC
Hematocrit: 40.1 % (ref 35.0–47.0)
Hemoglobin: 13.2 gm/dl (ref 11.0–16.0)
MCH: 27.4 pg (ref 25.4–34.6)
MCHC: 32.9 gm/dl (ref 30.0–36.0)
MCV: 83.2 fL (ref 80.0–98.0)
MPV: 12.9 fL — ABNORMAL HIGH (ref 6.0–10.0)
Platelets: 193 10*3/uL (ref 140–450)
RBC: 4.82 M/uL (ref 3.60–5.20)
RDW: 44.7 (ref 36.4–46.3)
WBC: 14.5 10*3/uL — ABNORMAL HIGH (ref 4.0–11.0)

## 2022-01-22 LAB — LACTATE DEHYDROGENASE: LD: 400 U/L — ABNORMAL HIGH (ref 120–246)

## 2022-01-22 MED ORDER — TERBUTALINE SULFATE 1 MG/ML IJ SOLN
1 | INTRAMUSCULAR | Status: DC
Start: 2022-01-22 — End: 2022-01-22

## 2022-01-22 MED ORDER — TRANEXAMIC ACID 1000 MG/10ML IV SOLN
1000 MG/10ML | Freq: Once | INTRAVENOUS | Status: DC | PRN
Start: 2022-01-22 — End: 2022-01-22

## 2022-01-22 MED ORDER — LACTATED RINGERS IV SOLN
INTRAVENOUS | Status: DC
Start: 2022-01-22 — End: 2022-01-22
  Administered 2022-01-22: 17:00:00 via INTRAVENOUS

## 2022-01-22 MED ORDER — NORMAL SALINE FLUSH 0.9 % IV SOLN
0.9 % | Freq: Two times a day (BID) | INTRAVENOUS | Status: DC
Start: 2022-01-22 — End: 2022-01-22

## 2022-01-22 MED ORDER — OXYTOCIN 30 UNITS IN 500 ML INFUSION
30 UNIT/500ML | INTRAVENOUS | Status: DC | PRN
Start: 2022-01-22 — End: 2022-01-22
  Administered 2022-01-22: 21:00:00 87.3 m[IU]/min via INTRAVENOUS

## 2022-01-22 MED ORDER — ONDANSETRON HCL 4 MG/2ML IJ SOLN
4 MG/2ML | Freq: Four times a day (QID) | INTRAMUSCULAR | Status: DC | PRN
Start: 2022-01-22 — End: 2022-01-22

## 2022-01-22 MED ORDER — LIDOCAINE-EPINEPHRINE 1.5 %-1:200000 IJ SOLN
1.5 %-1:200000 | INTRAMUSCULAR | Status: DC | PRN
Start: 2022-01-22 — End: 2022-01-22
  Administered 2022-01-22: 18:00:00 3 via EPIDURAL

## 2022-01-22 MED ORDER — LACTATED RINGERS IV SOLN
Freq: Once | INTRAVENOUS | Status: AC
Start: 2022-01-22 — End: 2022-01-22
  Administered 2022-01-22: 18:00:00 500 mL via INTRAVENOUS

## 2022-01-22 MED ORDER — SODIUM CHLORIDE 0.9 % IV SOLN
0.9 % | INTRAVENOUS | Status: DC | PRN
Start: 2022-01-22 — End: 2022-01-22

## 2022-01-22 MED ORDER — LACTATED RINGERS IV BOLUS
INTRAVENOUS | Status: DC | PRN
Start: 2022-01-22 — End: 2022-01-22

## 2022-01-22 MED ORDER — MISOPROSTOL 200 MCG PO TABS
200 MCG | ORAL | Status: DC | PRN
Start: 2022-01-22 — End: 2022-01-22

## 2022-01-22 MED ORDER — METHYLERGONOVINE MALEATE 0.2 MG/ML IJ SOLN
0.2 MG/ML | INTRAMUSCULAR | Status: DC | PRN
Start: 2022-01-22 — End: 2022-01-22

## 2022-01-22 MED ORDER — OXYTOCIN 0.06 UNIT/ML BOLUS FROM THE BAG (POST-PARTUM)
30 UNIT/500ML | INTRAVENOUS | Status: AC | PRN
Start: 2022-01-22 — End: 2022-01-22
  Administered 2022-01-22: 21:00:00 166.7 [IU] via INTRAVENOUS

## 2022-01-22 MED ORDER — NITROUS OXIDE 50% INHALATION
RESPIRATORY_TRACT | Status: DC | PRN
Start: 2022-01-22 — End: 2022-01-22

## 2022-01-22 MED ORDER — FENTANYL-BUPIVACAINE-NACL 0.2-0.1-0.9 MG/100ML-% EP SOLN
EPIDURAL | Status: DC
Start: 2022-01-22 — End: 2022-01-22
  Administered 2022-01-22: 19:00:00 12 mL/h via EPIDURAL

## 2022-01-22 MED ORDER — NORMAL SALINE FLUSH 0.9 % IV SOLN
0.9 % | INTRAVENOUS | Status: DC | PRN
Start: 2022-01-22 — End: 2022-01-22

## 2022-01-22 MED ORDER — PHENYLEPHRINE HCL 10 MG/ML SOLN (MIXTURES ONLY)
10 MG/ML | Status: DC | PRN
Start: 2022-01-22 — End: 2022-01-22
  Administered 2022-01-22: 19:00:00 100 via INTRAVENOUS

## 2022-01-22 MED ORDER — NALOXONE HCL 0.4 MG/ML IJ SOLN
0.4 MG/ML | Freq: Once | INTRAMUSCULAR | Status: DC | PRN
Start: 2022-01-22 — End: 2022-01-22

## 2022-01-22 MED ORDER — CARBOPROST TROMETHAMINE 250 MCG/ML IM SOLN
250 MCG/ML | INTRAMUSCULAR | Status: DC | PRN
Start: 2022-01-22 — End: 2022-01-22

## 2022-01-22 MED FILL — FENTANYL-BUPIVACAINE-NACL 0.2-0.1-0.9 MG/100ML-% EP SOLN: EPIDURAL | Qty: 100

## 2022-01-22 MED FILL — LACTATED RINGERS IV SOLN: INTRAVENOUS | Qty: 1000

## 2022-01-22 MED FILL — OXYTOCIN-SODIUM CHLORIDE 30-0.9 UT/500ML-% IV SOLN: INTRAVENOUS | Qty: 500

## 2022-01-22 MED FILL — TERBUTALINE SULFATE 1 MG/ML IJ SOLN: 1 MG/ML | INTRAMUSCULAR | Qty: 1

## 2022-01-22 MED FILL — LACTATED RINGERS IV SOLN: INTRAVENOUS | Qty: 500

## 2022-01-22 NOTE — Anesthesia Procedure Notes (Signed)
Epidural Block    Patient location during procedure: OB  Start time: 01/22/2022 2:15 PM  End time: 01/22/2022 2:25 PM  Reason for block: labor epidural  Staffing  Performed: resident/CRNA   Anesthesiologist: Coy Saunas, MD  Resident/CRNA: Dub Amis, APRN - CRNA  Performed by: Dub Amis, APRN - CRNA  Authorized by: Coy Saunas, MD    Epidural  Patient position: sitting  Prep: Betadine and site prepped and draped  Patient monitoring: continuous pulse ox and frequent blood pressure checks  Approach: midline  Location: L3-4  Injection technique: LOR saline  Guidance: landmarks  Provider prep: mask and sterile gloves  Needle  Needle type: Tuohy   Needle gauge: 18 G  Needle length: 3.5 in  Needle insertion depth: 7 cm  Catheter type: multi-orifice  Catheter size: 20 G  Catheter at skin depth: 13 cm  Test dose: negativeCatheter Secured: tegaderm and tape  Assessment  Sensory level: improved.  Hemodynamics: stable  Attempts: 1  Outcomes: uncomplicated and patient tolerated procedure well  Preanesthetic Checklist  Completed: patient identified, IV checked, site marked, risks and benefits discussed, surgical/procedural consents, equipment checked, pre-op evaluation, timeout performed, anesthesia consent given, oxygen available, monitors applied/VS acknowledged, fire risk safety assessment completed and verbalized and blood product R/B/A discussed and consented

## 2022-01-22 NOTE — Progress Notes (Signed)
Asked by MD to cover patient while he is in the OR.  Dr. Lucilla Edin notified.  Pt with decelerations, CNM remained at bedside for pushing and Dr. Lucilla Edin in before Dr. Lenn Sink arrived.  Dr. Lenn Sink arrived for delivery and performed procedure.

## 2022-01-22 NOTE — Progress Notes (Signed)
1130-pt in room    Epidural charting:  In-1407  Time-out-1413  Start-1415  Test dose-1425  [phone]

## 2022-01-22 NOTE — Anesthesia Pre-Procedure Evaluation (Signed)
Department of Anesthesiology  Preprocedure Note       Name:  Autumn Velez   Age:  36 y.o.  DOB:  03/17/1986                                          MRN:  [id number]         Date:  01/22/2022      Surgeon: * No surgeons listed *    Procedure: * No procedures listed *    Medications prior to admission:   Prior to Admission medications    Not on File       Current medications:    Current Facility-Administered Medications   Medication Dose Route Frequency Provider Last Rate Last Admin   . lactated ringers IV soln infusion 500 mL  500 mL IntraVENous Once Danise Mina, APRN - CRNA       . naloxone [facility]) injection 0.4 mg  0.4 mg IntraVENous Once PRN Danise Mina, APRN - CRNA       . ondansetron (ZOFRAN) injection 4 mg  4 mg IntraVENous Q6H PRN Danise Mina, APRN - CRNA       . fentaNYL 2 mcg/mL bupivacaine 0.1% in sodium chloride 0.9% 100 mL epidural infusion  12 mL/hr Epidural Continuous Danise Mina, APRN - CRNA           Allergies:    Allergies   Allergen Reactions   . Peanut-Containing Drug Products        Problem List:  There is no problem list on file for this patient.      Past Medical History:        Diagnosis Date   . Fatigue    . Iron deficiency    . Vitamin D deficiency        Past Surgical History:  History reviewed. No pertinent surgical history.    Social History:    Social History     Tobacco Use   . Smoking status: Never   . Smokeless tobacco: Never   Substance Use Topics   . Alcohol use: Not Currently                                Counseling given: Not Answered      Vital Signs (Current): There were no vitals filed for this visit.                                           BP Readings from Last 3 Encounters:   No data found for BP       NPO Status:                                                                                 BMI:   Wt Readings from Last 3 Encounters:   No data found for Wt     There is no height or  weight on file to calculate BMI.    CBC:   Lab  Results   Component Value Date/Time    WBC 14.5 01/22/2022 11:25 AM    RBC 4.82 01/22/2022 11:25 AM    HGB 13.2 01/22/2022 11:25 AM    HCT 40.1 01/22/2022 11:25 AM    MCV 83.2 01/22/2022 11:25 AM    RDW 44.7 01/22/2022 11:25 AM    PLT 193 01/22/2022 11:25 AM       CMP:   Lab Results   Component Value Date/Time    NA 136 01/22/2022 11:25 AM    K 5.1 01/22/2022 11:25 AM    CL 107 01/22/2022 11:25 AM    CO2 20 01/22/2022 11:25 AM    BUN <5 01/22/2022 11:25 AM    CREATININE 0.71 01/22/2022 11:25 AM    GFRAA >60.0 01/22/2022 11:25 AM    LABGLOM >60 01/22/2022 11:25 AM    GLUCOSE 82 01/22/2022 11:25 AM    PROT 8.6 01/22/2018 05:29 PM    CALCIUM 9.7 01/22/2022 11:25 AM    BILITOT 0.50 01/22/2022 11:25 AM    ALKPHOS 202 01/22/2022 11:25 AM    AST 17.0 01/22/2022 11:25 AM    ALT 20 01/22/2022 11:25 AM       POC Tests: No results for input(s): "POCGLU", "POCNA", "POCK", "POCCL", "POCBUN", "POCHEMO", "POCHCT" in the last 72 hours.    Coags: No results found for: "PROTIME", "INR", "APTT"    HCG (If Applicable): No results found for: "PREGTESTUR", "PREGSERUM", "HCG", "HCGQUANT"     ABGs: No results found for: "PHART", "PO2ART", "PCO2ART", "HCO3ART", "BEART", "O2SATART"     Type & Screen (If Applicable):  No results found for: "LABABO", "LABRH"    Drug/Infectious Status (If Applicable):  No results found for: "HIV", "HEPCAB"    COVID-19 Screening (If Applicable): No results found for: "COVID19"        Anesthesia Evaluation  Patient summary reviewed and Nursing notes reviewed  Airway: Mallampati: II  TM distance: >3 FB   Neck ROM: full  Mouth opening: > = 3 FB   Dental:          Pulmonary:Negative Pulmonary ROS and normal exam                               Cardiovascular:Negative CV ROS                      Neuro/Psych:   Negative Neuro/Psych ROS              GI/Hepatic/Renal: Neg GI/Hepatic/Renal ROS            Endo/Other: Negative Endo/Other ROS                    Abdominal:             Vascular: negative vascular ROS.          Other Findings: Gravid uterus          Anesthesia Plan      epidural     ASA 2             Anesthetic plan and risks discussed with patient.      Plan discussed with CRNA.                    Awanda Mink, APRN - CRNA   01/22/2022

## 2022-01-22 NOTE — Progress Notes (Addendum)
1925- Bedside report received from R. Contractor. Care assumed at this time. Patient is stable and sitting in bed eating. Patient has no complaints at this time.     2015- Patient taken to Mother/Baby unit via wheelchair. Bedside report given to [facility]. Patient is stable. Care turned over at this time.

## 2022-01-22 NOTE — ED Provider Notes (Cosign Needed)
History of Present Illness   36 y.o.  G 1 P 0  at 40 5/7 weeks    CC: Contractions every 2 minutes    Pt at front desk breathing heavily thru contractions.  Pt denies LOF, Vag Bleeding or Ctx     Denies Epigastric pain, Headaches, or Blurry/Visual changes.     Fetal movement +    Allergies   Peanuts    Record Review from Prenatals or previous visits   Primary OB: [facility]      OB History   Gravida Para Term Preterm AB Living   1             SAB IAB Ectopic Molar Multiple Live Births                    # Outcome Date GA Lbr Len/2nd Weight Sex Delivery Anes PTL Lv   1 Current                  PNL:  Blood Type: RH +   Ab screen: Negative  Rubella Immune  HBsAg negative  GC/CT negative  HIV negative  RPR negative  GBS negative    Please see prenatal records for details.    Review of Systems      General ROS: negative for - chills or fever  Respiratory ROS: no cough, shortness of breath, or wheezing  Cardiovascular ROS: no chest pain or dyspnea on exertion  Gastrointestinal ROS: + abdominal pain, No change in bowel habits, or black or bloody stools  Genito-Urinary ROS:  + Pelvic pressure  Neurological ROS: no TIA or stroke symptoms    Past Medical/Surgical History     Past Medical History:   Diagnosis Date    Fatigue     Iron deficiency     Vitamin D deficiency      No past surgical history on file.    Social History     Social History     Socioeconomic History    Marital status: Single     Spouse name: Not on file    Number of children: Not on file    Years of education: Not on file    Highest education level: Not on file   Occupational History    Not on file   Tobacco Use    Smoking status: Never    Smokeless tobacco: Never   Substance and Sexual Activity    Alcohol use: Not Currently    Drug use: Never    Sexual activity: Not on file   Other Topics Concern    Not on file   Social History Narrative    Not on file     Social Determinants of Health     Financial Resource Strain: Not on file   Food Insecurity:  Not on file   Transportation Needs: Not on file   Physical Activity: Not on file   Stress: Not on file   Social Connections: Not on file   Intimate Partner Violence: Not on file   Housing Stability: Not on file       Family History     Family History   Problem Relation Age of Onset    Colon Cancer Maternal Grandmother     Colon Cancer Mother     Other Father         Sarcoid    Hypertension Father        Current Medications     No current facility-administered  medications for this encounter.     Current Outpatient Medications   Medication Sig Dispense Refill    ibuprofen (ADVIL;MOTRIN) 600 MG tablet Take 600 mg by mouth every 6 hours as needed      norethindrone-ethinyl estradiol-iron (MICROGESTIN FE1.5/30) 1.5-30 MG-MCG tablet Take 1 tablet by mouth daily          Physical Exam   145/106; pulse 91; spo2 100%     General:  Pt AAOx3, No acute distress, normal mood and affect.  Well developed, hydrated and nourished. Appears stated age.  Neuro:  Grossly Intact  Skin:  Warm and Dry  Lungs: Unlabored breathing, regular rate  Heart:  Regular rate and rhythm  Abdomen: Soft and Non tender, Gravid    OB Exam     Cervical Exam: 4-5/100/0 station, BBOW  External Vulva: Normal with no lesions noted.    Membranes: Intact  Vaginal Discharge:  None    Fetal Monitoring/NST      Mode: External  Fetal Heart Rate: 130s     Uterine Activity:  Mode: External  Frequency (min): every 2- 29minutes        Diagnostic Studies   Lab:   No results found for this or any previous visit (from the past 12 hour(s)).      Impression and Management Plan/ Medical Decision Making   40 5/7 weeks  Uterine contractions/abdominal Pain- every 2-3 minutes starting at midnight  GBS negative  Elevated BP on arrival- suspect secondary to pain- will do [place] labs for baseline.      Prenatal records and previous visits were reviewed for care of this patient.    Orders made: Admit,   Labs available were reviewed:  Overall reassuring        Diagnosis   40  weeks  Uterine contractions  Elevated BP       Disposition:    Admit per Dr. Lenn Sink, he asked I also notify Dr. [place] since he is in surgery- voicemail left.        Rudy Jew, CNM  [facility]

## 2022-01-22 NOTE — Anesthesia Post-Procedure Evaluation (Signed)
Department of Anesthesiology  Postprocedure Note    Patient: Autumn Velez  MRN: [id number]  [place]: [birth year]  Date of evaluation: 01/22/2022      Procedure Summary     Date: 01/22/22 Room / Location:     Anesthesia Start: 0973 Anesthesia Stop: 1633    Procedure: Labor Analgesia Diagnosis:     Scheduled Providers:  Responsible Provider: Carolyne Littles, MD    Anesthesia Type: Epidural ASA Status: 2          Anesthesia Type: Epidural    [place] Phase I:      Aldrete Phase II:        Anesthesia Post Evaluation    Patient location during evaluation: bedside  Patient participation: complete - patient participated  Level of consciousness: awake and alert  Airway patency: patent  Nausea & Vomiting: no vomiting and no nausea  Complications: no  Cardiovascular status: hemodynamically stable  Respiratory status: acceptable  Hydration status: stable  Multimodal analgesia pain management approach  Pain management: adequate

## 2022-01-22 NOTE — Progress Notes (Addendum)
1514 - SBAR received from Sandy Salaam, RN  Placenta consents completed. Pt educated on taking placenta  1516 - K. Rapp at bedside. SVE completed.   37 - Called dr. Lenn Sink updated on lates, position changes, and SVE/SROM  1550 - Dr. Lenn Sink at bedside checking on pt status. MD in OR with another pt request for K. Rapp CNM to keep an eye on pt.   1605 - pt complete  1609 - requested Dr. Lenn Sink be called for complete and pushing status.   1609 - Pushing started  with K. Rapp Cnm per instructions from Dr. Lenn Sink in [place].   1627 - Dr. Lucilla Edin at bedside.   3382 - Dr. Lenn Sink called to bedside for delivery between cases. Spoke with Dr. Lenn Sink about his need to come to bedside for delivery  1630 - pushing on left side  1630 - Peds called to bedside for delivery and decelerations. 60 - Dr. Lenn Sink arrived for delivery  1632 - Pushing on right side  1633 - Delivery of baby. Educated pt on taking placenta with meconium pt desires to take placenta still. Placenta was removed by Doula prior to 2 hours post delivery  1902 - Educated pt on the need to void. Pt preferred to get up to use bathroom instead of straight catheter. Assisted pt up to bathroom to void x2 assist. Pt able to void.   1915 - Bedside shift change report given to Arman Filter, RN (oncoming nurse) by R. Debbe Bales, Rn (offgoing nurse). Report included the following information Nurse Handoff Report.

## 2022-01-22 NOTE — Progress Notes (Signed)
1550- Called Dr. Lenn Sink to give update on patient status. Anterior lip and 4 minute prolonged deceleration that is recovered    1551- Dr. Lenn Sink at bedside for delivery

## 2022-01-23 LAB — CBC
Hematocrit: 34.2 % — ABNORMAL LOW (ref 35.0–47.0)
Hemoglobin: 11 gm/dl (ref 11.0–16.0)
MCH: 26.9 pg (ref 25.4–34.6)
MCHC: 32.2 gm/dl (ref 30.0–36.0)
MCV: 83.6 fL (ref 80.0–98.0)
MPV: 12.6 fL — ABNORMAL HIGH (ref 6.0–10.0)
Platelets: 191 10*3/uL (ref 140–450)
RBC: 4.09 M/uL (ref 3.60–5.20)
RDW: 46.7 — ABNORMAL HIGH (ref 36.4–46.3)
WBC: 17.4 10*3/uL — ABNORMAL HIGH (ref 4.0–11.0)

## 2022-01-23 MED ORDER — LANSINOH LANOLIN EX CREA
CUTANEOUS | Status: AC | PRN
Start: 2022-01-23 — End: 2022-01-24

## 2022-01-23 MED ORDER — OXYCODONE HCL 5 MG PO TABS
5 | ORAL | Status: DC | PRN
Start: 2022-01-23 — End: 2022-01-24

## 2022-01-23 MED ORDER — SODIUM CHLORIDE 0.9 % IV SOLN
0.9 % | INTRAVENOUS | Status: AC | PRN
Start: 2022-01-23 — End: 2022-01-24

## 2022-01-23 MED ORDER — NORMAL SALINE FLUSH 0.9 % IV SOLN
0.9 % | INTRAVENOUS | Status: AC | PRN
Start: 2022-01-23 — End: 2022-01-24

## 2022-01-23 MED ORDER — NORMAL SALINE FLUSH 0.9 % IV SOLN
0.9 % | Freq: Two times a day (BID) | INTRAVENOUS | Status: DC
Start: 2022-01-23 — End: 2022-01-24

## 2022-01-23 MED ORDER — ACETAMINOPHEN 500 MG PO TABS
500 MG | Freq: Three times a day (TID) | ORAL | Status: AC
Start: 2022-01-23 — End: 2022-01-24
  Administered 2022-01-23 – 2022-01-24 (×6): 1000 mg via ORAL

## 2022-01-23 MED ORDER — METHYLERGONOVINE MALEATE 0.2 MG/ML IJ SOLN
0.2 MG/ML | INTRAMUSCULAR | Status: AC | PRN
Start: 2022-01-23 — End: 2022-01-24

## 2022-01-23 MED ORDER — DOCUSATE SODIUM 100 MG PO CAPS
100 MG | Freq: Two times a day (BID) | ORAL | Status: AC
Start: 2022-01-23 — End: 2022-01-24
  Administered 2022-01-23 – 2022-01-24 (×4): 100 mg via ORAL

## 2022-01-23 MED ORDER — LACTATED RINGERS IV SOLN
INTRAVENOUS | Status: AC
Start: 2022-01-23 — End: 2022-01-24

## 2022-01-23 MED ORDER — BENZOCAINE-MENTHOL 20-0.5 % EX AERO
CUTANEOUS | Status: AC | PRN
Start: 2022-01-23 — End: 2022-01-24

## 2022-01-23 MED ORDER — ONDANSETRON HCL 4 MG/2ML IJ SOLN
42 MG/2ML | Freq: Four times a day (QID) | INTRAMUSCULAR | Status: AC | PRN
Start: 2022-01-23 — End: 2022-01-24

## 2022-01-23 MED ORDER — TETANUS-DIPHTH-ACELL PERTUSSIS 5-2.5-18.5 LF-MCG/0.5 IM SUSY
INTRAMUSCULAR | Status: AC
Start: 2022-01-23 — End: 2022-01-24

## 2022-01-23 MED ORDER — MISOPROSTOL 200 MCG PO TABS
200 | ORAL | Status: DC | PRN
Start: 2022-01-23 — End: 2022-01-24

## 2022-01-23 MED ORDER — TRANEXAMIC ACID 1000 MG/10ML IV SOLN
1000 MG/10ML | Freq: Once | INTRAVENOUS | Status: AC | PRN
Start: 2022-01-23 — End: 2022-01-23

## 2022-01-23 MED ORDER — OXYCODONE HCL 5 MG PO TABS
5 MG | ORAL | Status: DC | PRN
Start: 2022-01-23 — End: 2022-01-24

## 2022-01-23 MED ORDER — IBUPROFEN 600 MG PO TABS
600 MG | Freq: Three times a day (TID) | ORAL | Status: AC
Start: 2022-01-23 — End: 2022-01-24
  Administered 2022-01-23 – 2022-01-24 (×6): 600 mg via ORAL

## 2022-01-23 MED ORDER — RHO D IMMUNE GLOBULIN 1500 UNITS IM SOSY
1500 units | Freq: Once | INTRAMUSCULAR | Status: AC | PRN
Start: 2022-01-23 — End: 2022-01-23

## 2022-01-23 MED FILL — ACETAMINOPHEN EXTRA STRENGTH 500 MG PO TABS: 500 MG | ORAL | Qty: 2

## 2022-01-23 MED FILL — IBUPROFEN 600 MG PO TABS: 600 MG | ORAL | Qty: 1

## 2022-01-23 MED FILL — DOCUSATE SODIUM 100 MG PO CAPS: 100 MG | ORAL | Qty: 1

## 2022-01-23 MED FILL — DERMOPLAST 20-0.5 % EX AERO: CUTANEOUS | Qty: 85

## 2022-01-23 MED FILL — LACTATED RINGERS IV SOLN: INTRAVENOUS | Qty: 1000

## 2022-01-23 NOTE — Lactation Note (Addendum)
This note was copied from a baby's chart.  Lactation visit    Mother reports that breastfeeding is going slowly and that infant has been sleepy. Mother states that infant pushes her nipple out with her tongue so she is giving infant a pacifier for suck training. Mother states that infant has had several 5-7 minute feeds and infant is voiding and stooling. Mother states that she is getting more comfortable with latching infant on her own. Educated mother on techniques to wake and stimulate infant for feeds and encouraged mother to do skin to skin before feeds. Spoke with mother about feeding tendencies of an infant over 24 hours of life, positioning, and feeding frequency. Mother verbalizes understanding and states she will call lactation for assistance with the next feed.    2200  In for latch assist. Infant placed in FB position on the R breast. Infant is sucking on her upper lip and on assessment, infant's suck is bity and uncoordinated. Educated mother on hand placement, head control, how to support infant, and hold breast. Infant opens minimally to latch. With manual manipulation of infant's jaw, a deeper latch is achieved and mother denies pain. Infant has moderate on/off behavior but re latches well. Infant breastfeeds for 6-7 minutes and falls asleep at the breast. Infant placed in the bassinet and mother aware to offer breast again in 3-4 hours or on demand.    26  Mother calls out for assistance. Mother states that infant was spitting up. Spoke with mother about amniotic fluid. Mother aware infant may not latch at this time but mother requests to try. Infant placed in FB position on the R breast. Infant opens wider to latch and offers 1-3 sucks a time but is unable to maintain latch. Infant placed skin to skin with mother and mother aware to offer breast again in 10-15 minutes.

## 2022-01-23 NOTE — Plan of Care (Signed)
Problem: Pain  Goal: Verbalizes/displays adequate comfort level or baseline comfort level  Outcome: Progressing  Flowsheets  Taken 01/23/2022 1457  Verbalizes/displays adequate comfort level or baseline comfort level:   Encourage patient to monitor pain and request assistance   Assess pain using appropriate pain scale  Taken 01/23/2022 0915  Verbalizes/displays adequate comfort level or baseline comfort level:   Encourage patient to monitor pain and request assistance   Assess pain using appropriate pain scale

## 2022-01-23 NOTE — Lactation Note (Signed)
This note was copied from a baby's chart.  Lactation note    Mother is a 38 year old G1 P1 vaginal delivery at 40+[redacted] weeks gestation.Mother has no medical or surgical history that would impede lactation.    Mother states that infant has been sleepy and she has been having difficulty getting infant to latch. Mother denies any soreness of the nipples. Mother requests assistance with latch. Suck training attempted with gloved finger, but infant only sucked a couple of times. Infant placed on the right breast in a football position. Educated mother on positioning of infant, hand placement, head control, how to support infant , and hold breast. Infant opened her mouth and achieved a deep latch after several attempts. Infant sucked a few times, then fell asleep. Infant remained skin to skin with mother. Instructed mother to latch infant if she begins showing any feeding cues.    Spoke with Mother about feeding expectations for the first 24 hours, feeding behaviors of a newborn infant, the benefits of skin to skin, amniotic fluid, and the size of the infant's stomach. Mother aware of void and stool expectations during the first 24 hours of life.  Mother aware to offer the breast every 3 to 4 hours or on demand, and that attempts at the breast count.  Mother encouraged  to call for lactation assistance if desired.

## 2022-01-23 NOTE — Progress Notes (Signed)
Progress Note    Patient: Autumn Velez MRN: [id number]  SSN: [id number]    Date of Birth: 09/24/1985  Age: 36 y.o.  Sex: female      Subjective:     Postpartum Day: 1     Delivery: vaginal delivery    The patient feels well. The patient denies emotional concerns. Pain is  well controlled with current medications. The baby is well. Baby is feeding via breast. Urinary output is adequate. Voiding spontaneous. The patient is ambulating well. The patient is tolerating a normal diet.    Objective:      Patient Vitals for the past 8 hrs:   BP Temp Pulse Resp SpO2   01/23/22 0556 112/73 98.4 F (36.9 C) 78 17 99 %     General:    alert, no distress   Lochia:  appropriate   Uterine Fundus:   firm   Fundus Location:  -1   DVT Evaluation:  No evidence of DVT seen on physical exam.  Negative Homan's sign.     Lab/Data Review:  CBC:   Lab Results   Component Value Date/Time    WBC 14.5 01/22/2022 11:25 AM    HGB 13.2 01/22/2022 11:25 AM    HCT 40.1 01/22/2022 11:25 AM    PLT 193 01/22/2022 11:25 AM       Assessment:     Status post: Doing well postpartum vaginal delivery     Plan:     Postpartum care discussed including diet, ambulation, and actvitiy restrictions.  Discharge instructions and questions answered for vaginal delivery.  Plan for Discharge home on PPD # 2    Signed By: Moody Bruins, MD     January 23, 2022

## 2022-01-24 MED ORDER — IBUPROFEN 600 MG PO TABS
600 MG | ORAL_TABLET | Freq: Three times a day (TID) | ORAL | 2 refills | Status: AC
Start: 2022-01-24 — End: ?

## 2022-01-24 MED FILL — SODIUM CHLORIDE FLUSH 0.9 % IV SOLN: 0.9 % | INTRAVENOUS | Qty: 10

## 2022-01-24 MED FILL — DERMOPLAST 20-0.5 % EX AERO: CUTANEOUS | Qty: 85

## 2022-01-24 MED FILL — IBUPROFEN 600 MG PO TABS: 600 MG | ORAL | Qty: 1

## 2022-01-24 MED FILL — ACETAMINOPHEN EXTRA STRENGTH 500 MG PO TABS: 500 MG | ORAL | Qty: 2

## 2022-01-24 MED FILL — DOCUSATE SODIUM 100 MG PO CAPS: 100 MG | ORAL | Qty: 1

## 2022-01-24 NOTE — Plan of Care (Signed)
Ready for discharge today

## 2022-01-24 NOTE — Progress Notes (Signed)
Post-Partum Day Number 2 Progress Note      Postpartum Day: 2 s/p Vaginal Delivery    Patient doing well without significant complaints.  Voiding without difficulty, urinary output is adequate, and she is ambulating & tolerating regular diet. She has passed flatus.  Pain is well controlled with current medications. Family is at bedside and supportive. Baby is feeding via breast and bottle. Baby has been sleepy and not latching per pt.       Vitals:  BP 122/82 Comment: RN reviewed  Pulse 80   Temp 97.7 F (36.5 C)   Resp 16   SpO2 99%   Breastfeeding Unknown   Temp (24hrs), Avg:98.1 F (36.7 C), Min:97.7 F (36.5 C), Max:98.4 F (36.9 C)    Vital signs stable, afebrile.    Exam:    Patient is in NAD.  Emotionally: appears to be stable  Breasts: engorged No, tender No, nipple cracks or bleeding No  Abdomen: soft, non tender, fundus firm -2 below the level of the umbilicus  Perineum:intact  Lochia scant noted.  Lower extremities are negative for cords, tenderness or signs of DVT.    Lab/Data Review:    CMP:   Recent Labs     01/22/22  1125   NA 136   K 5.1   CL 107   CO2 20   BUN <5*       CBC:   Recent Labs     01/22/22  1125 01/23/22  0724   WBC 14.5* 17.4*   HGB 13.2 11.0   HCT 40.1 34.2*   PLT 193 191         Assessment and Plan:    S/p NSVD without complications & is having an uncomplicated post-partum course. Stable for d/c.   Continue routine perineal care and maternal education.  Plan discharge home today.  Ofc to contact pt for 6wks PP appt.   Rx Motrin sent to pharmacy.      Education:  Post partum instructions Discussed, Handout Given  Breastfeeding discussed      Autumn Tulloch [place], MD  January 24, 2022  9:23 AM

## 2022-01-24 NOTE — Discharge Instructions (Signed)
Discharge Instructions  Right after birth and for the first Velez days, your vaginal flow will be bright red, similar to your menstrual flow.  After the first Velez days, your discharge will gradually change to a pale pink or brownish color.  By the end of the second week, you may have little more than a clear discharge.  Vaginal discharge can last up to six weeks.  It is important to change your pad on a regular basis.    Activity:  No lifting anything heavier than your infant for the first two to four weeks.      Rest frequently.  No vigorous exercise until approved by your [facility] doctor.     Gradually increase your daily activities until you are back to your normal routine.    Hygiene:  Shower only.  No baths for six weeks.       You may use soap on your incision and perineum.  Perineal sutures will       dissolve on their own.         If you had a C-Section, you may have staples that your doctor will remove in      the office.  Steri strips will fall off in about 7 - 10 days.      You should check your incision daily for signs of infection, including redness,      Drainage or swelling.  Always wipe from front to back after going to the toilet       and clean your perineum with warm tap water using the peri-bottle every time      you use the bathroom.  No tampons, douching, sexual intercourse - nothing in       your vagina - for the first six weeks after discharge.    Breast Care:  Wear a well fitting, supportive bra without underwire may be more              comfortable as your body adjusts to breast changes.             Wear breast pads - cotton is best - until your breasts stop leaking.  Change              the pads frequently.              If bottle feeding and your breasts begin filling, use cabbage leaves.               Instructions for use of cabbage are included in your                Admission/Discharge/Home folder.  You may use ice packs made of             Crushed ice in plastic bags that will mold to your  breasts.      Diet:    Drink plenty of juices and water.  Eat foods high in fiber such as bran, broccili,   cauliflower and fruits to help keep bowel movements regular.   Eat well balanced meals that include all five food groups:  Protein, vegetables,   fruits, grains and dairy.    Hormonal Changes:  Emotional changes and mood swings are not uncommon after               giving birth.  If this goes on for more than a two week period, it may              be  post partum depression.  Symptoms can include trouble              Sleeping, sleeping too much, feelings of irritability, anxiety, panic,               Hopelessness or guilt, lack of energy or motivation, poor                         concentration, and/or rapid mood swings.    Symptoms to Report to your Doctor:  Heavy Bleeding: blood is bright red and soaks a       sanitary pad in one hour or less.       Foul smelling vaginal discharge.       Difficult, painful or too-frequent urination.       Pain that becomes worse.       Mood swings or crying that feels out of control       Flu like symptoms       Temperature greater than 100.4       Incision is red, swollen or has any drainage       Trouble breathing, dizziness or faintness       Warmth, pain or redness in the lower leg                             These are general instructions for a healthy lifestyle:    No smoking/ No tobacco products/ Avoid exposure to second hand smoke    Surgeon General's Warning:  Quitting smoking now greatly reduces serious risk to your health.    Obesity, smoking, and sedentary lifestyle greatly increases your risk for illness    A healthy diet, regular physical exercise & weight monitoring are important for maintaining a healthy lifestyle    You may be retaining fluid if you have a history of heart failure or if you experience any of the following symptoms:  Weight gain of 3 pounds or more overnight or 5 pounds in a week, increased swelling in our hands or feet or shortness of breath  while lying flat in bed.  Please call your doctor as soon as you notice any of these symptoms; do not wait until your next office visit.    Recognize signs and symptoms of STROKE:    F-face looks uneven    A-arms unable to move or move unevenly    S-speech slurred or non-existent    T-time-call 911 as soon as signs and symptoms begin-DO NOT go       Back to bed or wait to see if you get better-TIME IS BRAIN.    Warning Signs of HEART ATTACK     Call 911 if you have these symptoms:  Chest discomfort. Most heart attacks involve discomfort in the center of the chest that lasts more than a Velez minutes, or that goes away and comes back. It can feel like uncomfortable pressure, squeezing, fullness, or pain.  Discomfort in other areas of the upper body. Symptoms can include pain or discomfort in one or both arms, the back, neck, jaw, or stomach.  Shortness of breath with or without chest discomfort.  Other signs may include breaking out in a cold sweat, nausea, or lightheadedness.  Don't wait more than five minutes to call 911 - MINUTES MATTER! Fast action can save your life. Calling 911 is almost always the fastest way to get lifesaving treatment. Emergency  Medical Services staff can begin treatment when they arrive -- up to an hour sooner than if someone gets to the hospital by car.       The discharge information has been reviewed with Autumn Velez   The {Autumn Velez verbalized understanding.                  Discharge medications reviewed with the {Dishcarge meds reviewed [id number]} and appropriate educational materials and side effects teaching were provided.       Learning About Preeclampsia After Childbirth  What is preeclampsia?     Preeclampsia is high blood pressure and signs of organ damage, such as protein in the urine, usually after 20 weeks of pregnancy. If it's not treated, preeclampsia can harm you or your baby.  Severe preeclampsia can lead to dangerous seizures (eclampsia). When preeclampsia affects  the liver, it can cause HELLP syndrome, a blood-clotting and bleeding problem. HELLP can come on quickly and can be dangerous. This is why your doctor checks you and your baby often.  Preeclampsia usually goes away after the baby is born. But symptoms may last or get worse after delivery. In rare cases, symptoms may not show up until days or even weeks after childbirth.  What are the symptoms?  Mild preeclampsia usually doesn't cause symptoms. But it may cause rapid weight gain and sudden swelling of the hands and face. Severe preeclampsia can cause symptoms such as a severe headache, vision problems, and trouble breathing. It also can cause belly pain. And you may urinate less than usual.  What can you expect after you've had preeclampsia?  In the hospital  After the baby and the placenta are delivered, preeclampsia usually starts to get better. Most people get better in the first Velez days after childbirth.  After having preeclampsia, you still have a risk of seizures for a day or more after childbirth. (In very rare cases, seizures happen later on.) So your doctor may have you take magnesium sulfate for a day or more to prevent seizures. You may also take medicine to lower your blood pressure.  When you go home  Your blood pressure will most likely return to normal a Velez days after delivery. Your doctor will want to check your blood pressure sometime in the first week after you leave the hospital.  High blood pressure sometimes continues after childbirth. But it usually returns to normal levels with time.  Take and record your blood pressure at home if your doctor tells you to.  Ask your doctor to check your blood pressure monitor to be sure that it is accurate and that the cuff fits you. Also ask your doctor to watch you use it, to make sure that you are using it right.  Don't eat, use tobacco products, or use medicine known to raise blood pressure (such as some nasal decongestant sprays) before you take your  blood pressure.  Avoid taking your blood pressure if you have just exercised or if you're nervous or upset. Rest at least 15 minutes before you take your blood pressure.  Take your medicines exactly as prescribed. Call your doctor if you think you are having a problem with your medicine.  If you smoke, quit or cut back as much as you can. Smoking and vaping can be harmful to your baby. Talk to your doctor if you need help quitting.  Eat a variety of healthy foods. Include plenty of foods high in calcium, such as dairy products, almonds, and dark  leafy greens.  Long-term health  After you've had preeclampsia, you have an increased risk of high blood pressure, heart disease, stroke, and kidney disease. This may be because the same things that cause preeclampsia also cause heart and kidney disease.  To protect your health, work with your doctor on living a heart-healthy lifestyle and getting the checkups you need. Your doctor may also want you to check your blood pressure at home.  Follow-up care is a key part of your treatment and safety. Be sure to make and go to all appointments, and call your doctor if you are having problems. It's also a good idea to know your test results and keep a list of the medicines you take.  When should you call for help?  Share this information with your partner or a friend. They can help you watch for warning signs.  Call 911  anytime you think you may need emergency care. For example, call if:    You passed out (lost consciousness).     You have a seizure.     You have trouble breathing.     You have chest pain.   Call your doctor now or seek immediate medical care if:    You have symptoms of preeclampsia, such as:  Sudden swelling of your face, hands, or feet.  New vision problems (such as dimness, blurring, or seeing spots).  A severe headache.     Your blood pressure is very high, such as 160/110 or higher.     Your blood pressure is higher than your doctor told you it should be, or  it rises quickly.     You have new nausea or vomiting.     You have pain in your belly or pelvis.     You gain weight rapidly.   Where can you learn more?  Go to https://www.bennett.info/ and enter Q718 to learn more about "Learning About Preeclampsia After Childbirth."  Current as of: November 06, 2021               Content Version: 13.8   2006-2023 Healthwise, Incorporated.   Care instructions adapted under license by [facility]. If you have questions about a medical condition or this instruction, always ask your healthcare professional. [place] any warranty or liability for your use of this information.         Depression After Childbirth: Care Instructions  It's common to lose sleep, feel irritable, and cry easily during the first Velez days after childbirth. Hormone changes and the demands of a new baby can cause these "baby blues." If these mood changes last more than 2 weeks, you may have postpartum depression. This is a medical condition that requires treatment.  If you have any of these signs, you may be depressed. See your doctor right away.    You feel very sad or hopeless and lose interest in daily activities.       You sleep too much or not enough.       You feel tired or as if you have no energy.       You eat too much or too little.       You write or talk about death.  Where to get help 24 hours a day, 7 days a week If you or someone you know talks about suicide, self-harm, a mental health crisis, a substance use crisis, or any other kind of emotional distress, get help right away. You can:  Consider saving  these numbers in your phone.  Call the Suicide and [place] at 11.  Call 1-800-273-TALK 913-406-0071).  Text HOME to 575-253-8562 to access the Crisis Text Line.     What you can do    Try to go to all of your counseling sessions.  Take medicines as directed.  Eat healthy foods.  Get daily exercise, such as walks.  Try to get some sunlight every day.  Avoid  using alcohol or other substances.  Get as much rest as possible.  Connect with friends, and join a support group for new parents.  When should you call for help?   Call 911 if:    You feel you cannot stop from hurting yourself, your baby, or someone else.   Call your doctor now or seek immediate medical care if:    You are having trouble caring for yourself or your baby.     You hear voices.   Contact your doctor if:    You have problems with your medicines.     You do not get better as expected.   Follow-up care is a key part of your treatment and safety. Be sure to make and go to all appointments, and call your doctor if you are having problems. It's also a good idea to know your test results and keep a list of the medicines you take.  Where can you learn more?  Go to https://www.bennett.info/ and enter K742 to learn more about "Depression After Childbirth: Care Instructions."  Current as of: October 21, 2021               Content Version: 13.8   2006-2023 Healthwise, Incorporated.   Care instructions adapted under license by [facility]. If you have questions about a medical condition or this instruction, always ask your healthcare professional. [place] any warranty or liability for your use of this information.

## 2022-01-24 NOTE — Telephone Encounter (Addendum)
----------  DocumentID: [id number] (AP)-------------------------------------------              [facility]                       Patient Education Report         Name: Autumn Velez, Autumn Velez                  Date: 01/22/2022    MRN: [id number]                    Time: 8:38:22 PM         Patient ordered video: 'Safe Sleep Practices'    from [id number]_[email or web address] via phone number: [phone] at 8:38:22 PM    Description: Safe Sleep Practices    ----------DocumentID: [id number] (AP)-------------------------------------------              [facility]                       Patient Education Report         Name: Autumn Velez, Autumn Velez                  Date: 01/23/2022    MRN: [id number]                    Time: 8:49:15 PM         Patient ordered video: 'Understanding Mother Baby Care'    from [email or web address] via phone number: [phone] at 8:49:15 PM    Description: This program helps you clearly understand the most current need-to-know discharge information-including postpartum recovery tips, breastfeeding basics, newborn care, and when to call healthcare providers.    ----------DocumentID: [id number] (AP)-------------------------------------------                       [facility]          Patient Education Report - Discharge Summary        Date: 01/24/2022   Time: 3:51:31 PM   Name: Autumn Velez, Autumn Velez   MRN: [id number]      Account Number: [id number]      Education History:        Patient ordered video: 'Safe Sleep Practices' from [email or web address] on 01/22/2022 08:38:22 PM    Patient ordered video: 'Understanding Mother Baby Care' from [email or web address] on 01/23/2022 08:49:15 PM

## 2022-01-24 NOTE — Plan of Care (Signed)
Problem: Vaginal Birth or Cesarean Section  Goal: Fetal and maternal status remain reassuring during the birth process  Description:  Birth OB-Pregnancy care plan goal which identifies if the fetal and maternal status remain reassuring during the birth process  Outcome: Completed     Problem: Pain  Goal: Verbalizes/displays adequate comfort level or baseline comfort level  01/24/2022 0742 by Mallie Mussel, RN  Outcome: Completed  01/24/2022 0651 by Fabian November, RN  Outcome: Adequate for Discharge  01/23/2022 1827 by Mallie Mussel, RN  Outcome: Progressing  Flowsheets  Taken 01/23/2022 1457  Verbalizes/displays adequate comfort level or baseline comfort level:   Encourage patient to monitor pain and request assistance   Assess pain using appropriate pain scale  Taken 01/23/2022 0915  Verbalizes/displays adequate comfort level or baseline comfort level:   Encourage patient to monitor pain and request assistance   Assess pain using appropriate pain scale

## 2022-01-24 NOTE — Progress Notes (Signed)
Discharge teaching reviewed. All questions answered. Prescriptions sent per MD to pharmacy.

## 2022-01-24 NOTE — Lactation Note (Signed)
This note was copied from a baby's chart.  Lactation note    Mom reports feedings improving. Mom needs max assistance with head control and positioning. Nipples short bilateral and baby has tight jaw and sucks on tongue. Demonstrated deeper latch with more head control and had mom return demonstration. Educated mom on suck training with pacifier. Discussed colostrum, weight loss, engorgement, mastitis and went to except mature milk to come in. Mom aware to call for help if needed.     Spoke with mother about feeding tendencies once at home, mature milk entry s/s, void/stool expectations. Mother provided with OP lactation number for needs and gave permission for OP f/u call. Mother states no further questions or concerns at this time.

## 2022-01-31 ENCOUNTER — Inpatient Hospital Stay
Admission: EM | Admit: 2022-01-31 | Discharge: 2022-02-02 | Disposition: A | Discharge status: Home / Self Care | Payer: PRIVATE HEALTH INSURANCE | Admitting: Obstetrics

## 2022-01-31 DIAGNOSIS — O1495 Unspecified pre-eclampsia, complicating the puerperium: Secondary | ICD-10-CM

## 2022-01-31 LAB — COMPREHENSIVE METABOLIC PANEL
ALT: 19 U/L (ref 10–49)
AST: 23 U/L (ref 0.0–33.9)
Albumin: 4 gm/dl (ref 3.4–5.0)
Alkaline Phosphatase: 162 U/L — ABNORMAL HIGH (ref 46–116)
Anion Gap: 9 mmol/L (ref 5–15)
BUN: 7 mg/dl — ABNORMAL LOW (ref 9–23)
CO2: 27 mEq/L (ref 20–31)
Calcium: 9.5 mg/dl (ref 8.7–10.4)
Chloride: 103 mEq/L (ref 98–107)
Creatinine: 0.88 mg/dl (ref 0.55–1.02)
GFR African American: >60
GFR Non-African American: >60
Glucose: 80 mg/dl (ref 74–106)
Potassium: 3.6 mEq/L (ref 3.5–5.1)
Sodium: 139 mEq/L (ref 136–145)
Total Bilirubin: 0.7 mg/dl (ref 0.30–1.20)
Total Protein: 7.5 gm/dl (ref 5.7–8.2)

## 2022-01-31 LAB — POCT URINALYSIS DIPSTICK
Bilirubin, Urine: NEGATIVE
Glucose, Ur: NEGATIVE mg/dl
Ketones, Urine: NEGATIVE mg/dl
Nitrite, Urine: NEGATIVE
Protein, Urine: NEGATIVE mg/dl
Specific Gravity, Urine: 1.015 (ref 1.005–1.030)
Urobilinogen, Urine: 0.2 EU/dl (ref 0.0–1.0)
pH, Urine: 7 (ref 5–9)

## 2022-01-31 LAB — CBC WITH AUTO DIFFERENTIAL
Basophils: 0.5 % (ref 0–3)
Eosinophils: 0.6 % (ref 0–5)
Hematocrit: 43.5 % (ref 35.0–47.0)
Hemoglobin: 14 gm/dl (ref 11.0–16.0)
Immature Granulocytes: 1.1 % (ref 0.0–3.0)
Lymphocytes: 26 % — ABNORMAL LOW (ref 28–48)
MCH: 27.2 pg (ref 25.4–34.6)
MCHC: 32.2 gm/dl (ref 30.0–36.0)
MCV: 84.5 fL (ref 80.0–98.0)
MPV: 10.6 fL — ABNORMAL HIGH (ref 6.0–10.0)
Monocytes: 5.8 % (ref 1–13)
Neutrophils Segmented: 66 % — ABNORMAL HIGH (ref 34–64)
Nucleated RBCs: 0 (ref 0–0)
Platelets: 419 10*3/uL (ref 140–450)
RBC: 5.15 M/uL (ref 3.60–5.20)
RDW: 44.8 (ref 36.4–46.3)
WBC: 8.6 10*3/uL (ref 4.0–11.0)

## 2022-01-31 LAB — EKG 12-LEAD
Atrial Rate: 92 {beats}/min
Calculated P Axis: 64 degrees
Calculated R Axis: 56 degrees
Calculated T Axis: 37 degrees
P-R Interval: 136 ms
Q-T Interval: 342 ms
QRS Duration: 76 ms
QTC Calculation (Bezet): 422 ms
Ventricular Rate: 92 {beats}/min

## 2022-01-31 LAB — PROTIME-INR
INR: 1 (ref 0.1–1.1)
Protime: 11.8 seconds (ref 10.2–12.9)

## 2022-01-31 LAB — MAGNESIUM: Magnesium: 1.9 mg/dL (ref 1.6–2.6)

## 2022-01-31 LAB — MICROSCOPIC URINALYSIS

## 2022-01-31 MED ORDER — NIFEDIPINE ER OSMOTIC RELEASE 30 MG PO TB24
30 MG | Freq: Every day | ORAL | Status: AC
Start: 2022-01-31 — End: ?
  Administered 2022-01-31 – 2022-02-02 (×3): 30 mg via ORAL

## 2022-01-31 MED ORDER — LABETALOL HCL 100 MG PO TABS
100 MG | Freq: Two times a day (BID) | ORAL | Status: AC
Start: 2022-01-31 — End: ?
  Administered 2022-02-01 – 2022-02-02 (×4): 200 mg via ORAL

## 2022-01-31 MED ORDER — ACETAMINOPHEN 325 MG PO TABS
325 | ORAL | Status: DC | PRN
Start: 2022-01-31 — End: 2022-02-02

## 2022-01-31 MED ORDER — MAGNESIUM SULFATE 20000 MG/500 ML INFUSION
20 GM/500ML | INTRAVENOUS | Status: DC
Start: 2022-01-31 — End: 2022-02-01
  Administered 2022-01-31 – 2022-02-01 (×2): 2000 mg/h via INTRAVENOUS

## 2022-01-31 MED ORDER — CALCIUM GLUCONATE 10 % IV SOLN
10 % | INTRAVENOUS | Status: DC | PRN
Start: 2022-01-31 — End: 2022-02-01

## 2022-01-31 MED ORDER — ONDANSETRON HCL 4 MG/2ML IJ SOLN
4 MG/2ML | Freq: Four times a day (QID) | INTRAMUSCULAR | Status: AC | PRN
Start: 2022-01-31 — End: ?

## 2022-01-31 MED ORDER — LACTATED RINGERS IV SOLN
INTRAVENOUS | Status: AC
Start: 2022-01-31 — End: ?

## 2022-01-31 MED ORDER — NORMAL SALINE FLUSH 0.9 % IV SOLN
0.9 % | Freq: Two times a day (BID) | INTRAVENOUS | Status: AC
Start: 2022-01-31 — End: ?

## 2022-01-31 MED ORDER — MAGNESIUM SULFATE 4 GM/100ML IV SOLN
4 GM/100ML | Freq: Once | INTRAVENOUS | Status: AC
Start: 2022-01-31 — End: 2022-01-31
  Administered 2022-01-31: 18:00:00 4000 mg via INTRAVENOUS

## 2022-01-31 MED ORDER — MAGNESIUM SULFATE 4000 MG/100 ML IVPB PREMIX
4 GM/100ML | INTRAVENOUS | Status: AC
Start: 2022-01-31 — End: 2022-01-31
  Administered 2022-01-31: 17:00:00 4000 mg via INTRAVENOUS

## 2022-01-31 MED FILL — LACTATED RINGERS IV SOLN: INTRAVENOUS | Qty: 1000

## 2022-01-31 MED FILL — MAGNESIUM SULFATE 20 GM/500ML IV SOLN: 20 GM/500ML | INTRAVENOUS | Qty: 500

## 2022-01-31 MED FILL — MAGNESIUM SULFATE 4 GM/100ML IV SOLN: 4 GM/100ML | INTRAVENOUS | Qty: 100

## 2022-01-31 MED FILL — NIFEDIPINE ER OSMOTIC RELEASE 30 MG PO TB24: 30 MG | ORAL | Qty: 1

## 2022-01-31 NOTE — ED Notes (Signed)
Patient ambulated to bathroom with IV pole. Patient notified that we need a urine sample and was given a specimen container.      Jed Limerick, RN  01/31/22 1319

## 2022-01-31 NOTE — ED Notes (Signed)
Patient asked for nurse to bring keys out to patient's family member in waiting room to get the car seat. Patient's keys were given to family member in waiting room.      Jed Limerick, RN  01/31/22 1250

## 2022-01-31 NOTE — ED Notes (Signed)
TRANSFER - OUT REPORT:    Verbal report given to Mother & Baby nurse on Tania Ade being transferred to 3218 for routine progression of patient care       Report consisted of patient's Situation, Background, Assessment and   Recommendations(SBAR).     Information from the following report(s) Nurse Handoff Report, ED Encounter Summary, ED SBAR, Adult Overview, MAR, and Recent Results was reviewed with the receiving nurse.  Kinder Assessment: No data recorded  Lines:   Peripheral IV 01/31/22 Right Antecubital (Active)   Site Assessment Clean, dry & intact 01/31/22 1214   Line Status Blood return noted;Specimen collected;Normal saline locked 01/31/22 1214   Phlebitis Assessment No symptoms 01/31/22 1214   Infiltration Assessment 0 01/31/22 1214   Dressing Status New dressing applied;Clean, dry & intact 01/31/22 1214   Dressing Type Transparent 01/31/22 1214   Dressing Intervention New 01/31/22 1214      Medications sent with patient from pharmacy: None to send  Patient belongings: Belongings sent to floor    Opportunity for questions and clarification was provided.      Patient transported with:  Monitor and Registered Nurse           Jed Limerick, RN  01/31/22 1358

## 2022-01-31 NOTE — ED Triage Notes (Signed)
Patient just had a vaginal birth this past Tuesday. Patient with increased swelling in the legs an tingling in the feet. Denies headache or dizziness. Noted that her pressure has been elevated.

## 2022-01-31 NOTE — H&P (Signed)
Gynecology History and Physical    Name: Editha Bridgeforth MRN: [id number] SSN: [id number]    Date of Birth: Mar 20, 1986  Age: 36 y.o.  Sex: female       Subjective:      Chief complaint:  High blood pressure and swelling    Rowynn is a 36 y.o. African American female G1P1 with a history of vaginal delivery on 01/22/22. She says that for the past couple of days she has had some swelling in her ankles and feet. She says she went to [facility] yesterday and her BP was normal but today it was elevated. She says that she had tingling in her feet and she came into the ER..    OB History       Gravida   1    Para   1    Term   1    Preterm        AB        Living   1         SAB        IAB        Ectopic        Molar        Multiple   0    Live Births   1              Past Medical History:   Diagnosis Date    Fatigue     Iron deficiency     Vitamin D deficiency      No past surgical history on file.  Social History     Occupational History    Not on file   Tobacco Use    Smoking status: Never    Smokeless tobacco: Never   Substance and Sexual Activity    Alcohol use: Not Currently    Drug use: Never    Sexual activity: Not on file     Family History   Problem Relation Age of Onset    Colon Cancer Maternal Grandmother     Colon Cancer Mother     Other Father         Sarcoid    Hypertension Father         Allergies   Allergen Reactions    Peanut (Diagnostic) Anaphylaxis    Peanut-Containing Drug Products      Prior to Admission medications    Medication Sig Start Date End Date Taking? Authorizing Provider   ibuprofen (ADVIL;MOTRIN) 600 MG tablet Take 1 tablet by mouth every 8 hours 01/24/22   [place], Khadijah, MD        Review of Systems:  A comprehensive review of systems was negative except for that written in the History of Present Illness.     Objective:     Vitals:    01/31/22 1515 01/31/22 1530 01/31/22 1600 01/31/22 1713   BP: (!) 145/90 134/85 (!) 144/80 (!) 149/96   Pulse: 88 77 80 88   Resp: 18 16 16 18    Temp: 97.7 F  (36.5 C)      TempSrc: Oral      SpO2: 98% 99% 99% 98%   Weight:       Height:           Physical Exam:  Patient without distress., Heart: Regular rate and rhythm, Lung: clear to auscultation throughout lung fields, no wheezes, no rales, no rhonchi, and normal respiratory effort, Abdomen: soft, nontender, and Lower Extremities:  - No evidence  of DVT seen on physical exam.  Negative Homan's sign.    Assessment:     36 y/o G1P1 s/p SVD PPD # 9 with Elevated BP rule out PIH    Plan:     - Admit to Postpartum  - PIH labs  - magnesium sulfate per protocol  - start Procardia 30 XL daily and Labetalol 200 mg BID  - Will reassess in the am and if no improvement will consider medicine consult.      Signed By:  Moody Bruins, MD     January 31, 2022

## 2022-01-31 NOTE — ED Provider Notes (Signed)
[place]  Emergency Department Treatment Report        Patient: Autumn Velez Age: 36 y.o. Sex: female    Date of Birth: [birth year] Admit Date: 01/31/2022 PCP: Jerline Pain, MD   MRN: [id number]  CSN: [id number]     Room: ER33/ER33 Time Dictated: 1:33 PM            Chief Complaint   Chief Complaint   Patient presents with    Pre-Eclampsia       History of Present Illness   This is a 36 y.o. female history of fatigue iron deficiency vitamin D deficiency who presents with concern of elevated blood pressure.  Patient is a G1, P1 postpartum by roughly 10 days who had elevated blood pressure of 140 during her delivery which was felt to be due to pain blood pressure then normalized out of the low 1 teens prior to discharge.  She took her blood pressure today noticed it was 160 at the pharmacy and went to the  [facility] office.  OB heard of the blood pressure and sent her in here.  She notes bilateral leg swelling and tingling with no visual changes no abdominal pain.    Review of Systems   Review of Systems   Constitutional:  Negative for activity change.   HENT:  Negative for congestion.    Respiratory:  Negative for apnea.    All other systems reviewed and are negative.        Past Medical/Surgical History     Past Medical History:   Diagnosis Date    Fatigue     Iron deficiency     Vitamin D deficiency      No past surgical history on file.    Social History     Social History     Socioeconomic History    Marital status: Single     Spouse name: Not on file    Number of children: Not on file    Years of education: Not on file    Highest education level: Not on file   Occupational History    Not on file   Tobacco Use    Smoking status: Never    Smokeless tobacco: Never   Substance and Sexual Activity    Alcohol use: Not Currently    Drug use: Never    Sexual activity: Not on file   Other Topics Concern    Not on file   Social History Narrative    Not on file     Social Determinants of Health     Financial  Resource Strain: Not on file   Food Insecurity: Not on file   Transportation Needs: Not on file   Physical Activity: Not on file   Stress: Not on file   Social Connections: Not on file   Intimate Partner Violence: Not on file   Housing Stability: Not on file       Family History     Family History   Problem Relation Age of Onset    Colon Cancer Maternal Grandmother     Colon Cancer Mother     Other Father         Sarcoid    Hypertension Father        Current Medications     Current Facility-Administered Medications   Medication Dose Route Frequency Provider Last Rate Last Admin    magnesium sulfate 4000 mg in 100 mL IVPB premix  4,000 mg IntraVENous NOW Horace Porteous,  Rosalyn Gess, MD 25 mL/hr at 01/31/22 1239 4,000 mg at 01/31/22 1239     Current Outpatient Medications   Medication Sig Dispense Refill    ibuprofen (ADVIL;MOTRIN) 600 MG tablet Take 1 tablet by mouth every 8 hours 30 tablet 2       Allergies     Allergies   Allergen Reactions    Peanut (Diagnostic) Anaphylaxis    Peanut-Containing Drug Products        Physical Exam   Patient Vitals for the past 24 hrs:   Temp Pulse Resp BP SpO2   01/31/22 1130 98.1 F (36.7 C) 98 16 (!) 140/102 100 %     Physical Exam  Constitutional:       Appearance: Normal appearance.   HENT:      Head: Normocephalic and atraumatic.      Nose: Nose normal.      Mouth/Throat:      Mouth: Mucous membranes are moist.   Eyes:      Conjunctiva/sclera: Conjunctivae normal.      Pupils: Pupils are equal, round, and reactive to light.   Cardiovascular:      Rate and Rhythm: Normal rate.      Pulses: Normal pulses.   Pulmonary:      Effort: Pulmonary effort is normal.   Abdominal:      General: Abdomen is flat.   Musculoskeletal:         General: Normal range of motion.      Cervical back: Normal range of motion and neck supple.   Skin:     General: Skin is warm and dry.      Capillary Refill: Capillary refill takes less than 2 seconds.   Neurological:      General: No focal deficit present.       Mental Status: She is alert.   Psychiatric:         Mood and Affect: Mood normal.         Thought Content: Thought content normal.           Impression and Management Plan   RECORDS REVIEWED:  I reviewed the patient's previous records here at [facility] and available outside facilities and note that delivery on 926 otherwise last seen here in February for nausea    EXTERNAL RESULTS REVIEWED: Followed outpatient for routine medical care by OB as well as family medicine    INDEPENDENT HISTORIAN:  History and/or plan development assisted by: Patient    Severe exacerbation or progression of chronic illness: Acute hypertension in pregnancy      Threat to body function without evaluation and management: Loss of life or limb      SOCIAL DETERMINANTS  impacting Evaluation and Management:      Comorbidities impacting Evaluation and Management: Age    Differential diagnoses hypertension preeclampsia    Initial impression: Patient presents with elevated blood pressure concern of preeclampsia start labs give mag and discuss with OB    Diagnostic Studies   Lab:   Results for orders placed or performed during the hospital encounter of 01/31/22   Protime-INR   Result Value Ref Range    Protime 11.8 10.2 - 12.9 seconds    INR 1.0 0.1 - 1.1     CBC with Auto Differential   Result Value Ref Range    WBC 8.6 4.0 - 11.0 1000/mm3    RBC 5.15 3.60 - 5.20 M/uL    Hemoglobin 14.0 11.0 - 16.0 gm/dl  Hematocrit 43.5 35.0 - 47.0 %    MCV 84.5 80.0 - 98.0 fL    MCH 27.2 25.4 - 34.6 pg    MCHC 32.2 30.0 - 36.0 gm/dl    Platelets 390 300 - 450 1000/mm3    MPV 10.6 (H) 6.0 - 10.0 fL    RDW 44.8 36.4 - 46.3      Nucleated RBCs 0 0 - 0      Immature Granulocytes 1.1 0.0 - 3.0 %    Neutrophils Segmented 66.0 (H) 34 - 64 %    Lymphocytes 26.0 (L) 28 - 48 %    Monocytes 5.8 1 - 13 %    Eosinophils 0.6 0 - 5 %    Basophils 0.5 0 - 3 %   Comprehensive Metabolic Panel   Result Value Ref Range    Potassium 3.6 3.5 - 5.1 mEq/L    Chloride 103 98 - 107 mEq/L     Sodium 139 136 - 145 mEq/L    CO2 27 20 - 31 mEq/L    Glucose 80 74 - 106 mg/dl    BUN 7 (L) 9 - 23 mg/dl    Creatinine 9.23 3.00 - 1.02 mg/dl    GFR African American >60.0      GFR Non-African American >60      Calcium 9.5 8.7 - 10.4 mg/dl    Anion Gap 9 5 - 15 mmol/L    AST 23.0 0.0 - 33.9 U/L    ALT 19 10 - 49 U/L    Alkaline Phosphatase 162 (H) 46 - 116 U/L    Total Bilirubin 0.70 0.30 - 1.20 mg/dl    Total Protein 7.5 5.7 - 8.2 gm/dl    Albumin 4.0 3.4 - 5.0 gm/dl   Magnesium   Result Value Ref Range    Magnesium 1.9 1.6 - 2.6 mg/dL   EKG 12 Lead   Result Value Ref Range    Ventricular Rate 92 BPM    Atrial Rate 92 BPM    P-R Interval 136 ms    QRS Duration 76 ms    Q-T Interval 342 ms    QTC Calculation (Bezet) 422 ms    Calculated P Axis 64 degrees    Calculated R Axis 56 degrees    Calculated T Axis 37 degrees    DIAGNOSIS, 93000       Sinus rhythm with premature supraventricular complexes and fusion complexes  Possible Left atrial enlargement  Borderline ECG  No previous ECGs available  Confirmed by Tyson Alias, M.D., K. Shahid ([id number]) on 01/31/2022 1:07:18 PM       Imaging:    No orders to display         EKG: Interpreted by me as sinus at 92 normal axis normal intervals no ST elevation or depression no hypertrophy frequent PVCs    None    ED Course         Critical Care Time (if necessary)   Critical Care Time:  The services I provided to this patient were to treat and/or prevent clinically significant deterioration that could result in the failure of one or more body systems and/or organ systems due to preecclampsia.    Services included the following:  -reviewing nursing notes and old charts  -vital sign assessments  -direct patient care  -medication orders and management  -interpreting and reviewing diagnostic studies/labs  -re-evaluations  -documentation time    Aggregate critical care time was 35 minutes, which includes only time during  which I was engaged in work directly related to the patient's care as  described above, whether I was at bedside or elsewhere in the Emergency Department. It did not include time spent performing other reported procedures or the services of residents, students, nurses, or advance practice providers.       Algis Downs, MD      1:33 PM      Medications   magnesium sulfate 4000 mg in 100 mL IVPB premix (4,000 mg IntraVENous New Bag 01/31/22 1239)           NARRATIVE:  Case discussed with Dr. Latanya Maudlin for [facility] requested immediate transfer the patient up to mother-baby 3 W.      Medical Decision Making           Final Diagnosis       ICD-10-CM    1. Pre-eclampsia in postpartum period  O14.95               Disposition   Admit       Renae Fickle, MD  January 31, 2022    My signature above authenticates this document and my orders, the final    diagnosis (es), discharge prescription (s), and instructions in the Epic    record.  If you have any questions please contact [phone]     Nursing notes have been reviewed by the physician/ advanced practice    Clinician.       Algis Downs, MD  01/31/22 [phone]

## 2022-02-01 LAB — MAGNESIUM
Magnesium: 3.5 mg/dL — ABNORMAL HIGH (ref 1.6–2.6)
Magnesium: 4.8 mg/dL — ABNORMAL HIGH (ref 1.6–2.6)
Magnesium: 4.9 mg/dL — ABNORMAL HIGH (ref 1.6–2.6)
Magnesium: 6.2 mg/dL (ref 1.6–2.6)
Magnesium: 6.3 mg/dL (ref 1.6–2.6)

## 2022-02-01 LAB — PROTEIN / CREATININE RATIO, URINE
Creatinine, Random Urine: 11.9 mg/dl — ABNORMAL LOW (ref 30.0–125.0)
Protein, Urine, Random: <6 mg/dl (ref 1.0–14.0)
Urine Total Protein Creatinine Ratio: UNDETERMINED gm/24 Hr (ref 0.00–0.19)

## 2022-02-01 MED FILL — MAGNESIUM SULFATE 20 GM/500ML IV SOLN: 20 GM/500ML | INTRAVENOUS | Qty: 500

## 2022-02-01 MED FILL — NIFEDIPINE ER OSMOTIC RELEASE 30 MG PO TB24: 30 MG | ORAL | Qty: 1

## 2022-02-01 MED FILL — LABETALOL HCL 100 MG PO TABS: 100 MG | ORAL | Qty: 2

## 2022-02-01 NOTE — Progress Notes (Signed)
Progress Note    Name: Autumn Velez   Medical Record Number: [id number]   Date of Birth: 06-Mar-1986  Today's Date: February 01, 2022      Readmission: PP Preeclampsia HD#2  S/p SVD 01/22/2022    Patient doing well and only c/o int headache. She denies visual disturbances, epigastric pain.   Tolerating Labetalol & Procardia, tol reg diet w/o N/V. S/p Mag Sulfate x 24 hrs d/c at 1230pm today.   Family at bedside and supportive.    02/01/22 1218 -- 18 93 127/84 98 -- -- -- -- 100 -- -- -- -- -- -- -- -- -- -- --      02/01/22 1118 -- 16 100 120/80 93 -- -- -- -- 99 -- -- -- -- -- -- -- -- -- -- --     02/01/22 1030 98.3 (36.8) 18 90 135/81 99 -- -- -- -- 100 -- -- -- -- -- -- -- -- -- -- --     02/01/22 0930 -- 16 87 127/83 98 -- -- -- -- 100 -- -- -- -- -- -- -- -- -- -- --     02/01/22 0843 -- 16 84 135/90 105 -- -- -- -- 99 -- -- -- -- -- -- -- -- -- -- --     02/01/22 0730 -- 18 88 125/89 101 -- -- -- -- 100 -- -- -- -- -- -- -- -- -- -- --     02/01/22 0600 98.8 (37.1) 16 88 107/78 88 -- -- -- -- 99 -- 0 -- -- -- -- -- -- -- -- --     02/01/22 0510 -- 16 83 109/74 86 -- -- -- -- 99 -- 0 -- -- -- -- -- -- -- -- --     02/01/22 0402 -- 16 74 111/79 90 -- -- -- -- 99 -- 0 -- -- -- -- -- -- -- -- --     02/01/22 0300 98.1 (36.7) 16 88 128/79 95 -- -- -- -- 98 -- 0 -- -- -- -- -- -- -- -- --     02/01/22 0208 -- 16 80 104/62 76 -- -- -- -- 98 -- 0 -- -- -- -- -- -- -- -- --     02/01/22 0103 -- 16 82 113/84 94 -- -- -- -- 98 -- 0 -- -- -- -- -- -- -- -- --     02/01/22 0000 -- 16 89 132/81 98 -- -- -- -- 99 -- 0 -- -- -- -- -- -- -- -- --     01/31/22 2305 97.8 (36.6) 16 90 131/85 100 -- -- -- -- 98 -- 0 -- -- -- -- -- -- -- -- --     01/31/22 2210 -- 16 86 133/78 96 -- -- -- -- 99 -- 0 -- -- -- -- -- -- -- -- --     01/31/22 2103 -- 16 98 143/78 100 -- -- -- -- 99 -- 0 -- -- -- -- -- -- -- -- --     01/31/22 2015 -- 18 107 147/81 103 -- -- -- -- 99 -- 0 -- -- -- -- -- -- -- -- --     01/31/22 1925 -- -- -- -- -- -- --  -- -- -- -- 0 -- -- -- -- -- -- -- -- --     01/31/22 1918 98.2 (36.8) 16 95 150/91 111 -- -- -- -- 99 -- -- -- -- -- -- -- -- -- -- --  01/31/22 1801 -- -- 86 148/94 112 -- -- -- -- 99 -- -- -- -- -- -- -- -- -- -- --     01/31/22 1713 -- 18 88 149/96 114 -- -- -- -- 98 -- -- -- -- -- -- -- -- -- -- --     01/31/22 1600 -- 16 80 144/80 101 -- -- -- -- 99 -- -- -- -- -- -- -- -- -- -- --     01/31/22 1530 -- 16 77 134/85 101 -- -- -- -- 99 -- -- -- -- -- -- -- -- -- -- --     01/31/22 1515 97.7 (36.5) 18 88 145/90 108 -- -- -- -- 98 -- -- -- -- -- -- -- -- -- -- --     01/31/22 1456 -- 16 84 141/89 106 -- -- -- -- 98 -- -- -- -- -- -- -- -- -- -- --     01/31/22 1442 -- 18 88 138/96 110 -- -- -- -- 98 -- -- -- -- -- -- -- -- -- -- --     01/31/22 1431 -- 16 86 142/92 109 -- -- -- -- 99 -- -- -- -- -- -- -- -- -- -- --     01/31/22 1428 -- 16 80 146/90 109 -- -- -- -- 100 -- -- -- -- -- -- -- -- -- -- --     01/31/22 1425 -- 18 97 133/97 109 -- -- -- -- 100 -- -- -- -- -- -- -- -- -- -- --     01/31/22 1422 -- 18 95 144/90 108 -- -- -- -- 100 -- -- -- -- -- -- -- -- -- -- --     01/31/22 1346 -- 22 82 131/91 104 -- -- -- -- 98 -- -- -- -- -- -- -- -- -- -- --     01/31/22 1130 98.1 (36.7) 16 98 140/102 115 --                      Gen: conversing with smile in NAD  M/S-no clonus      A/P  PP Preeclampsia HD#2  Plan d/c home tomorrow if remains stable  Cont labetalol & Procardia      Xavier Munger Martinique, MD  February 01, 2022  2:35 PM

## 2022-02-01 NOTE — Progress Notes (Signed)
Spoke with Dr. Martinique. Ok to D/c mag at 1230.

## 2022-02-02 MED ORDER — NIFEDIPINE ER OSMOTIC RELEASE 30 MG PO TB24
30 MG | ORAL_TABLET | Freq: Every day | ORAL | 3 refills | Status: AC
Start: 2022-02-02 — End: ?

## 2022-02-02 MED ORDER — LABETALOL HCL 200 MG PO TABS
200 MG | ORAL_TABLET | Freq: Two times a day (BID) | ORAL | 3 refills | Status: AC
Start: 2022-02-02 — End: ?

## 2022-02-02 MED FILL — LABETALOL HCL 100 MG PO TABS: 100 MG | ORAL | Qty: 2

## 2022-02-02 MED FILL — NIFEDIPINE ER OSMOTIC RELEASE 30 MG PO TB24: 30 MG | ORAL | Qty: 1

## 2022-02-02 NOTE — Progress Notes (Signed)
Readmission: PP Preeclampsia HD#3   S/p MagSulfate x 24 hrs  Labetalol & Procardia po   S/p SVD 01/22/2022    Patient doing well without significant complaints.  Voiding without difficulty, urinary output is adequate, and she is ambulating & tolerating regular diet. She is pumping breast milk. She denies HA, epigastric pains or visual disturbances.       Vitals:  BP 110/76   Pulse 85   Temp 99.3 F (37.4 C) (Oral)   Resp 16   Ht 5\' 8"  (1.727 m)   Wt 200 lb 9.6 oz (91 kg)   SpO2 98%   BMI 30.50 kg/m   Temp (24hrs), Avg:98.8 F (37.1 C), Min:98.3 F (36.8 C), Max:99.3 F (37.4 C)    02/02/22 1008 99.3 (37.4) 16 85 110/76 87 -- Supine -- -- 98 -- 1 -- -- -- -- -- -- -- -- 1      02/02/22 0800 -- -- -- -- -- -- -- -- -- -- None (Room air) -- -- -- -- -- -- -- -- -- --     02/02/22 0542 98.8 (37.1) 18 89 113/85 94 -- Supine -- -- 100 -- 0 -- -- -- -- -- -- -- -- 0     02/02/22 0228 98.6 (37) 18 91 103/65 78 -- Supine -- -- 98 -- 0 -- -- -- -- -- -- -- -- 0     02/01/22 2225 -- -- -- -- -- -- -- -- -- -- None (Room air) -- -- -- -- -- -- -- -- -- 0     02/01/22 2207 98.6 (37) 18 92 118/71 87 -- Supine -- -- 98 -- 0 -- -- -- -- -- -- -- -- 0     10//06/23 1/840 99.3 (37.4) 18 95 124/84 97 -- Sitting -- -- 100 -- 3 -- -- -- -- -- -- -- -- 0     02/01/22 1453 99 (37.2) 18 94 109/77 88 -- Sitting -- -- 98 -- 4 -- -- -- -- -- -- -- -- 0     02/01/22 1218 -- 18 93 127/84 98 --                    Vital signs stable, afebrile.    Exam:    Patient is in NAD.  Emotionally: appears to be stable  Breasts: almost full bottle milk being pumped  Lower extremities are negative for cords, tenderness or signs of DVT. No clonus    CMP:   Recent Labs     01/31/22  1203 01/31/22  1945 02/01/22  0652 02/01/22  1142 02/01/22  1821   NA 139  --   --   --   --    K 3.6  --   --   --   --    CL 103  --   --   --   --    CO2 27  --   --   --   --    BUN 7*  --   --   --   --    MG 1.9   < > 6.2* 6.3* 3.5*    < > = values in this  interval not displayed.       CBC:   Recent Labs     01/31/22  1203   WBC 8.6   HGB 14.0   HCT 43.5   PLT 419         Assessment  and Plan:    PP Preeclampsia HD#3  Stable on labetalol 200mg  po BID & Procardia XL 30mg  QD- Rx given  S/p NSVD  9.26.2023  Continue routine perineal care and maternal education.  Plan discharge home today.  Ofc to contact pt for BP check in 1 wk.  Rec low sodium diet and rest.      Tyra Michelle , MD  February 02, 2022  10:25 AM

## 2022-02-02 NOTE — Progress Notes (Signed)
All discharge blood pressure teaching completed .  Patient verbalized understanding. Discharged to car via walking.

## 2022-02-02 NOTE — Telephone Encounter (Addendum)
----------  DocumentID: [id number] (AP)-------------------------------------------              [facility]                       Patient Education Report         Name: Autumn Velez, Autumn Velez                  Date: 02/01/2022    MRN: [id number]                    Time: 1:40:03 AM         Patient ordered video: 'Patient Safety: Stay Safe While you are in the Hospital'    from [email or web address] via phone number: [phone] at 1:40:03 AM    Description: This program outlines some of the precautions patients can take to ensure a speedy recovery without extra complications. The video emphasizes the importance of communicating with the healthcare team.    ----------DocumentID: [id number] (AP)-------------------------------------------                       [facility]          Patient Education Report - Discharge Summary        Date: 02/02/2022   Time: 12:04:43 PM   Name: Autumn Velez, Autumn Velez   MRN: [id number]      Account Number: [id number]      Education History:        Patient ordered video: 'Patient Safety: Stay Safe While you are in the Hospital' from [email or web address] on 02/01/2022 01:40:03 AM

## 2022-02-02 NOTE — Discharge Instructions (Signed)
Learning About Preeclampsia After Childbirth  What is preeclampsia?     Preeclampsia is high blood pressure and signs of organ damage, such as protein in the urine, usually after 20 weeks of pregnancy. If it's not treated, preeclampsia can harm you or your baby.  Severe preeclampsia can lead to dangerous seizures (eclampsia). When preeclampsia affects the liver, it can cause HELLP syndrome, a blood-clotting and bleeding problem. HELLP can come on quickly and can be dangerous. This is why your doctor checks you and your baby often.  Preeclampsia usually goes away after the baby is born. But symptoms may last or get worse after delivery. In rare cases, symptoms may not show up until days or even weeks after childbirth.  What are the symptoms?  Mild preeclampsia usually doesn't cause symptoms. But it may cause rapid weight gain and sudden swelling of the hands and face. Severe preeclampsia can cause symptoms such as a severe headache, vision problems, and trouble breathing. It also can cause belly pain. And you may urinate less than usual.  What can you expect after you've had preeclampsia?  In the hospital  After the baby and the placenta are delivered, preeclampsia usually starts to get better. Most people get better in the first few days after childbirth.  After having preeclampsia, you still have a risk of seizures for a day or more after childbirth. (In very rare cases, seizures happen later on.) So your doctor may have you take magnesium sulfate for a day or more to prevent seizures. You may also take medicine to lower your blood pressure.  When you go home  Your blood pressure will most likely return to normal a few days after delivery. Your doctor will want to check your blood pressure sometime in the first week after you leave the hospital.  High blood pressure sometimes continues after childbirth. But it usually returns to normal levels with time.  Take and record your blood pressure at home if your  doctor tells you to.  Ask your doctor to check your blood pressure monitor to be sure that it is accurate and that the cuff fits you. Also ask your doctor to watch you use it, to make sure that you are using it right.  Don't eat, use tobacco products, or use medicine known to raise blood pressure (such as some nasal decongestant sprays) before you take your blood pressure.  Avoid taking your blood pressure if you have just exercised or if you're nervous or upset. Rest at least 15 minutes before you take your blood pressure.  Take your medicines exactly as prescribed. Call your doctor if you think you are having a problem with your medicine.  If you smoke, quit or cut back as much as you can. Smoking and vaping can be harmful to your baby. Talk to your doctor if you need help quitting.  Eat a variety of healthy foods. Include plenty of foods high in calcium, such as dairy products, almonds, and dark leafy greens.  Long-term health  After you've had preeclampsia, you have an increased risk of high blood pressure, heart disease, stroke, and kidney disease. This may be because the same things that cause preeclampsia also cause heart and kidney disease.  To protect your health, work with your doctor on living a heart-healthy lifestyle and getting the checkups you need. Your doctor may also want you to check your blood pressure at home.  Follow-up care is a key part of your treatment and safety. Be sure to   make and go to all appointments, and call your doctor if you are having problems. It's also a good idea to know your test results and keep a list of the medicines you take.  When should you call for help?  Share this information with your partner or a friend. They can help you watch for warning signs.  Call 911  anytime you think you may need emergency care. For example, call if:    You passed out (lost consciousness).     You have a seizure.     You have trouble breathing.     You have chest pain.   Call your doctor  now or seek immediate medical care if:    You have symptoms of preeclampsia, such as:  Sudden swelling of your face, hands, or feet.  New vision problems (such as dimness, blurring, or seeing spots).  A severe headache.     Your blood pressure is very high, such as 160/110 or higher.     Your blood pressure is higher than your doctor told you it should be, or it rises quickly.     You have new nausea or vomiting.     You have pain in your belly or pelvis.     You gain weight rapidly.   Where can you learn more?  Go to https://www.healthwise.net/patientEd and enter Q718 to learn more about "Learning About Preeclampsia After Childbirth."  Current as of: November 06, 2021               Content Version: 13.8   2006-2023 [employer].   Care instructions adapted under license by [facility]. If you have questions about a medical condition or this instruction, always ask your healthcare professional. [employer] disclaims any warranty or liability for your use of this information.

## 2022-02-08 NOTE — Telephone Encounter (Signed)
Formatting of this note might be different from the original.  Returning call, VM is full   Electronically signed by Claris Gladden at 02/08/2022  2:50 PM EDT

## 2022-02-22 DIAGNOSIS — N8110 Cystocele, unspecified: Secondary | ICD-10-CM

## 2022-02-22 NOTE — ED Provider Notes (Signed)
[facility]  Emergency Department Treatment Report    Patient: Autumn Velez Age: 36 y.o. Sex: female    Date of Birth: 02-04-1986 Admit Date: 02/22/2022 PCP: Jerline Pain, MD   MRN: [id number]  CSN: [id number]  Attending: Tawanna Cooler, MD   Room: [id number] Time Dictated: 12:36 AM APP: Dawayne Patricia, PA-C             Chief Complaint   Chief Complaint   Patient presents with    Vaginal Prolapse         History of Present Illness     36 y.o. female with a hx  of Preeclampsia 4 weeks postpartum with single live vaginal female delivery without assistive devices presents to the emergency department with concerns that she is having protrusion through her vagina.  She noticed this yesterday when she was sitting on the toilet and straining to have a bowel movement.  Patient has been sexually active for the past 1 week after delivering.  She denies any changes in her lochia denies any burning upon urination or frequent urination.  She did sustain a second-degree tear during delivery.      Review of Systems     Constitutional: No fever, chills, or weight loss  Eyes: No visual symptoms.  ENT: No sore throat, runny nose or ear pain.  Respiratory: No cough, dyspnea or wheezing.  Cardiovascular: No chest pain, pressure, palpitations, tightness or heaviness.  Gastrointestinal: No vomiting, diarrhea or abdominal pain.  Genitourinary: as above No dysuria, frequency, or urgency.  Musculoskeletal: No joint pain or swelling.  Integumentary: No rashes.  Neurological: No headaches, sensory or motor symptoms.  Denies complaints in all other systems.      Past Medical/Surgical History     Past Medical History:   Diagnosis Date    Fatigue     Iron deficiency     Vitamin D deficiency      No past surgical history on file.      Social History     Social History     Socioeconomic History    Marital status: Single   Tobacco Use    Smoking status: Never    Smokeless tobacco: Never   Substance and Sexual Activity     Alcohol use: Not Currently    Drug use: Never       Family History     Family History   Problem Relation Age of Onset    Colon Cancer Maternal Grandmother     Colon Cancer Mother     Other Father         Sarcoid    Hypertension Father        Current Medications     Previous Medications    IBUPROFEN (ADVIL;MOTRIN) 600 MG TABLET    Take 1 tablet by mouth every 8 hours    LABETALOL (NORMODYNE) 200 MG TABLET    Take 1 tablet by mouth every 12 hours    NIFEDIPINE (PROCARDIA XL) 30 MG EXTENDED RELEASE TABLET    Take 1 tablet by mouth daily         Allergies     Allergies   Allergen Reactions    Peanut (Diagnostic) Anaphylaxis    Peanut-Containing Drug Products        Physical Exam     ED Triage Vitals [02/22/22 2212]   Enc Vitals Group      BP 109/72      Pulse 97  Respirations 18      Temp 99 F (37.2 C)      Temp Source Oral      SpO2 100 %      Weight - Scale 88.9 kg (196 lb)      Height 1.727 m (5\' 8" )       Constitutional: Patient appears well developed and well nourished.  Appearance and behavior are age and situation appropriate.  HEENT: Conjunctiva clear.  PERRL. Bilateral TM non edematous or erythematous. Mucous membranes moist, non-erythematous. Surface of the pharynx, palate, and tongue are pink, moist and without lesions.  Neck: supple, non tender, symmetrical, no masses or JVD.   Respiratory: lungs clear to auscultation, nonlabored respirations. No tachypnea or accessory muscle use.  Cardiovascular: heart regular rate and rhythm without murmur rubs or gallops.    Distal pulses 2+ and equal bilaterally.  No peripheral edema.  Gastrointestinal:  Abdomen soft, nontender without complaint of pain to palpation. Normoactive bowel sounds. No peritoneal findings.   Genitourinary: Genitalia without swelling lesions discharge no urethral swelling discharge vaginal walls have no bulging lesions or laceration.  Patient with a healing laceration to the posterior fourchette of the vagina.  Cervical os is pink slightly  open there is no bleeding there is no tissue in the vaginal vault no adnexal tenderness  Musculoskeletal: Ext: Warm/well perfused, DP/PT/radial pulses palpable bilaterally, no bony tenderness, no obvious deformities or lesions.  Integumentary: warm and dry without rashes or lesions  Neurologic: alert and oriented,         Impression and Management Plan     36 year old afebrile nontoxic-appearing female who presents emergency department with concern of vaginal prolapse.  Differential diagnosis does include but not limited to rectocele, cystocele, uterine prolapse.  On physical exam no evidence of prolapse.              Diagnostic Studies     Lab:   No results found for this or any previous visit (from the past 12 hour(s)).    Imaging:    No orders to display           ED Course/ Medical Decision Making      Medications - No data to display        Remained stable in the emergency department did not require further intervention.  She was stable for discharge to follow-up with her OB/GYN doctor micrograms.  She will return to ED if symptom persist or worsen.      RECORDS REVIEWED:  I reviewed the patient's previous records here at [facility] and available outside facilities and note that patient evaluated in this emergency department June 06, 2021 for fever while 7 and half weeks gravid    EXTERNAL RESULTS REVIEWED: None    INDEPENDENT HISTORIAN:  History and/or plan development assisted by: Patient    Severe exacerbation or progression of chronic illness: None none      Threat to body function without evaluation and management: Reproductive      SOCIAL DETERMINANTS  impacting Evaluation and Management: Access to care      Comorbidities impacting Evaluation and Management: Preeclampsia      Critical Care Time (if necessary)       I am the first provider for this patient.     I reviewed the vital signs, available nursing notes, past medical history, past surgical history, family history and social history.      I have spoken  to June 08, 2021, MD regarding this patient's care  and we discussed past medical history, physical exam, ER course, lab results imaging results disposition plan and treatment        Final Diagnosis     1. Vaginal prolapse          Disposition          Medication List        ASK your doctor about these medications      ibuprofen 600 MG tablet  Commonly known as: ADVIL;MOTRIN  Take 1 tablet by mouth every 8 hours     labetalol 200 MG tablet  Commonly known as: NORMODYNE  Take 1 tablet by mouth every 12 hours     NIFEdipine 30 MG extended release tablet  Commonly known as: PROCARDIA XL  Take 1 tablet by mouth daily                 Patient discharged stable to home.  Follow-up with primary care physician.  Return to the ED if symptom persist or worsen.    Rita Ohara  February 23, 2022        The patient was personally evaluated by myself and Despina Hick, MD who agrees with the above assessment and plan.    My signature above authenticates this document and my orders, the final    diagnosis (es), discharge prescription (s), and instructions in the Epic    record.  If you have any questions please contact [phone].     Nursing notes have been reviewed by the physician/ advanced practice    Clinician.  Dragon medical dictation software was used for portions of this report. Unintended voice recognition errors may occur.                       Shireen Quan, [place]  02/23/22 [phone]

## 2022-02-22 NOTE — ED Triage Notes (Signed)
Ambulatory to triage c/o vaginal prolapse.     Vaginal birth 4 days ago- went to L&D but sent here

## 2022-02-23 ENCOUNTER — Inpatient Hospital Stay
Admit: 2022-02-23 | Discharge: 2022-02-23 | Disposition: A | Discharge status: Home / Self Care | Payer: PRIVATE HEALTH INSURANCE | Attending: Emergency Medicine

## 2022-02-23 NOTE — Discharge Instructions (Addendum)
Colace is safe in breast feeding, use as package directed      Follow up with primary care physician and OB/GYN     Return to ER if symptoms worsen

## 2022-02-23 NOTE — ED Notes (Signed)
Pt seen, treated, and discharged by provider.  I did not physically  care for this pt.   Assisted PA with female examination.  Pt A+Ox4     Carles Collet, LPN  32/95/18 [id number]
